# Patient Record
Sex: Female | Born: 1965 | Hispanic: Yes | Smoking: Never smoker
Health system: Southern US, Community
[De-identification: ages and names within clinical notes are randomized; demographics above are authoritative.]

## PROBLEM LIST (undated history)

## (undated) DIAGNOSIS — F419 Anxiety disorder, unspecified: Secondary | ICD-10-CM

## (undated) DIAGNOSIS — K219 Gastro-esophageal reflux disease without esophagitis: Secondary | ICD-10-CM

## (undated) DIAGNOSIS — T7840XA Allergy, unspecified, initial encounter: Secondary | ICD-10-CM

## (undated) DIAGNOSIS — G4730 Sleep apnea, unspecified: Secondary | ICD-10-CM

## (undated) HISTORY — PX: OTHER SURGICAL HISTORY: SHX169

## (undated) HISTORY — DX: Anxiety disorder, unspecified: F41.9

## (undated) HISTORY — DX: Sleep apnea, unspecified: G47.30

## (undated) HISTORY — DX: Allergy, unspecified, initial encounter: T78.40XA

## (undated) HISTORY — DX: Gastro-esophageal reflux disease without esophagitis: K21.9

---

## 2010-06-01 ENCOUNTER — Encounter (INDEPENDENT_AMBULATORY_CARE_PROVIDER_SITE_OTHER): Payer: Self-pay | Admitting: *Deleted

## 2010-06-22 ENCOUNTER — Encounter (INDEPENDENT_AMBULATORY_CARE_PROVIDER_SITE_OTHER): Payer: Self-pay | Admitting: *Deleted

## 2011-01-12 NOTE — Letter (Signed)
Summary: Marie Patient letter  [facility]  [address] [place], [place] [address]   Phone: [phone]  Fax: [phone]       06/01/2010 MRN: [id number]  [facility] [address] [place], [place]  [address]  Dear [name],  Welcome to the Gastroenterology Division at [facility].    You are scheduled to see Dr.  Juanda Chance on 08-31-10 at 2:45pm on the 3rd floor at [facility], [address]  We ask that you try to arrive at our office 15 minutes prior to your appointment time to allow for check-in.  We would like you to complete the enclosed self-administered evaluation form prior to your visit and bring it with you on the day of your appointment.  We will review it with you.  Also, please bring a complete list of all your medications or, if you prefer, bring the medication bottles and we will list them.  Please bring your insurance card so that we may make a copy of it.  If your insurance requires a referral to see a specialist, please bring your referral form from your primary care physician.  Co-payments are due at the time of your visit and may be paid by cash, check or credit card.     Your office visit will consist of a consult with your physician (includes a physical exam), any laboratory testing he/she may order, scheduling of any necessary diagnostic testing (e.g. x-ray, ultrasound, CT-scan), and scheduling of a procedure (e.g. Endoscopy, Colonoscopy) if required.  Please allow enough time on your schedule to allow for any/all of these possibilities.    If you cannot keep your appointment, please call [phone] to cancel or reschedule prior to your appointment date.  This allows [place] the opportunity to schedule an appointment for another patient in need of care.  If you do not cancel or reschedule by 5 p.m. the business day prior to your appointment date, you will be charged a $50.00 late cancellation/no-show fee.    Thank you for choosing [facility] for your medical needs.  We appreciate the opportunity to care for you.  Please visit [place] at our website  to learn more about our practice.                     Sincerely,                                                             The Gastroenterology Division

## 2011-01-12 NOTE — Letter (Signed)
Summary: [facility]  [facility]   Imported By: Lamona Curl CMA (AAMA) 08/26/2010 16:33:25  _____________________________________________________________________  External Attachment:    Type:   Image     Comment:   External Document

## 2013-11-15 ENCOUNTER — Ambulatory Visit: Payer: Self-pay | Admitting: Family Medicine

## 2013-11-15 LAB — CBC WITH DIFFERENTIAL/PLATELET
Basophil #: 0 10*3/uL (ref 0.0–0.1)
Basophil %: 0.8 %
Eosinophil #: 0.2 10*3/uL (ref 0.0–0.7)
Eosinophil %: 2.7 %
HCT: 40 % (ref 35.0–47.0)
HGB: 13.5 g/dL (ref 12.0–16.0)
Lymphocyte #: 1.5 10*3/uL (ref 1.0–3.6)
Lymphocyte %: 26.5 %
MCH: 28.6 pg (ref 26.0–34.0)
Monocyte #: 0.4 x10 3/mm (ref 0.2–0.9)
RBC: 4.72 10*6/uL (ref 3.80–5.20)
RDW: 13.5 % (ref 11.5–14.5)

## 2013-11-15 LAB — RAPID STREP-A WITH REFLX: Micro Text Report: NEGATIVE

## 2013-11-15 LAB — MONONUCLEOSIS SCREEN: Mono Test: NEGATIVE

## 2013-11-18 LAB — BETA STREP CULTURE(ARMC)

## 2015-07-11 ENCOUNTER — Other Ambulatory Visit: Payer: Self-pay | Admitting: Internal Medicine

## 2015-09-28 ENCOUNTER — Encounter: Payer: Self-pay | Admitting: Internal Medicine

## 2015-09-28 DIAGNOSIS — K5901 Slow transit constipation: Secondary | ICD-10-CM | POA: Insufficient documentation

## 2015-09-28 DIAGNOSIS — J309 Allergic rhinitis, unspecified: Secondary | ICD-10-CM | POA: Insufficient documentation

## 2015-09-28 DIAGNOSIS — E782 Mixed hyperlipidemia: Secondary | ICD-10-CM | POA: Insufficient documentation

## 2015-09-28 DIAGNOSIS — B009 Herpesviral infection, unspecified: Secondary | ICD-10-CM | POA: Insufficient documentation

## 2015-09-28 DIAGNOSIS — Z87898 Personal history of other specified conditions: Secondary | ICD-10-CM | POA: Insufficient documentation

## 2015-10-09 ENCOUNTER — Other Ambulatory Visit: Payer: Self-pay | Admitting: Internal Medicine

## 2015-11-12 ENCOUNTER — Ambulatory Visit (INDEPENDENT_AMBULATORY_CARE_PROVIDER_SITE_OTHER): Payer: BLUE CROSS/BLUE SHIELD | Admitting: Internal Medicine

## 2015-11-12 ENCOUNTER — Encounter: Payer: Self-pay | Admitting: Internal Medicine

## 2015-11-12 VITALS — BP 122/60 | HR 64 | Ht 64.0 in | Wt 212.2 lb

## 2015-11-12 DIAGNOSIS — Z114 Encounter for screening for human immunodeficiency virus [HIV]: Secondary | ICD-10-CM | POA: Diagnosis not present

## 2015-11-12 DIAGNOSIS — K5901 Slow transit constipation: Secondary | ICD-10-CM | POA: Diagnosis not present

## 2015-11-12 DIAGNOSIS — H9311 Tinnitus, right ear: Secondary | ICD-10-CM | POA: Diagnosis not present

## 2015-11-12 DIAGNOSIS — G4700 Insomnia, unspecified: Secondary | ICD-10-CM | POA: Diagnosis not present

## 2015-11-12 DIAGNOSIS — M62830 Muscle spasm of back: Secondary | ICD-10-CM

## 2015-11-12 DIAGNOSIS — J309 Allergic rhinitis, unspecified: Secondary | ICD-10-CM | POA: Diagnosis not present

## 2015-11-12 DIAGNOSIS — F5101 Primary insomnia: Secondary | ICD-10-CM | POA: Insufficient documentation

## 2015-11-12 DIAGNOSIS — Z0000 Encounter for general adult medical examination without abnormal findings: Secondary | ICD-10-CM

## 2015-11-12 DIAGNOSIS — E785 Hyperlipidemia, unspecified: Secondary | ICD-10-CM | POA: Diagnosis not present

## 2015-11-12 LAB — POCT URINALYSIS DIPSTICK
Bilirubin, UA: NEGATIVE
Blood, UA: NEGATIVE
Glucose, UA: NEGATIVE
KETONES UA: NEGATIVE
Leukocytes, UA: NEGATIVE
NITRITE UA: NEGATIVE
PH UA: 7
Protein, UA: NEGATIVE
Spec Grav, UA: <=1.005
Urobilinogen, UA: 0.2

## 2015-11-12 MED ORDER — VALACYCLOVIR HCL 1 G PO TABS: 1000.0000 mg | ORAL_TABLET | Freq: Every day | ORAL | Status: DC

## 2015-11-12 MED ORDER — POLYETHYLENE GLYCOL 3350 17 GM/SCOOP PO POWD: 17.0000 g | Freq: Every day | ORAL | Status: DC

## 2015-11-12 MED ORDER — METHOCARBAMOL 750 MG PO TABS: 750.0000 mg | ORAL_TABLET | Freq: Every evening | ORAL | Status: DC | PRN

## 2015-11-12 MED ORDER — BUPROPION HCL ER (XL) 150 MG PO TB24
150.0000 mg | ORAL_TABLET | Freq: Every day | ORAL | Status: DC
Start: 2015-11-12 — End: 2015-12-14

## 2015-11-12 NOTE — Patient Instructions (Signed)
Breast Self-Awareness Practicing breast self-awareness may pick up problems early, prevent significant medical complications, and possibly save your life. By practicing breast self-awareness, you can become familiar with how your breasts look and feel and if your breasts are changing. This allows you to notice changes early. It can also offer you some reassurance that your breast health is good. One way to learn what is normal for your breasts and whether your breasts are changing is to do a breast self-exam. If you find a lump or something that was not present in the past, it is best to contact your caregiver right away. Other findings that should be evaluated by your caregiver include nipple discharge, especially if it is bloody; skin changes or reddening; areas where the skin seems to be pulled in (retracted); or new lumps and bumps. Breast pain is seldom associated with cancer (malignancy), but should also be evaluated by a caregiver. HOW TO PERFORM A BREAST SELF-EXAM The best time to examine your breasts is 5-7 days after your menstrual period is over. During menstruation, the breasts are lumpier, and it may be more difficult to pick up changes. If you do not menstruate, have reached menopause, or had your uterus removed (hysterectomy), you should examine your breasts at regular intervals, such as monthly. If you are breastfeeding, examine your breasts after a feeding or after using a breast pump. Breast implants do not decrease the risk for lumps or tumors, so continue to perform breast self-exams as recommended. Talk to your caregiver about how to determine the difference between the implant and breast tissue. Also, talk about the amount of pressure you should use during the exam. Over time, you will become more familiar with the variations of your breasts and more comfortable with the exam. A breast self-exam requires you to remove all your clothes above the waist. 1. Look at your breasts and nipples.  Stand in front of a mirror in a room with good lighting. With your hands on your hips, push your hands firmly downward. Look for a difference in shape, contour, and size from one breast to the other (asymmetry). Asymmetry includes puckers, dips, or bumps. Also, look for skin changes, such as reddened or scaly areas on the breasts. Look for nipple changes, such as discharge, dimpling, repositioning, or redness. 2. Carefully feel your breasts. This is best done either in the shower or tub while using soapy water or when flat on your back. Place the arm (on the side of the breast you are examining) above your head. Use the pads (not the fingertips) of your three middle fingers on your opposite hand to feel your breasts. Start in the underarm area and use  inch (2 cm) overlapping circles to feel your breast. Use 3 different levels of pressure (light, medium, and firm pressure) at each circle before moving to the next circle. The light pressure is needed to feel the tissue closest to the skin. The medium pressure will help to feel breast tissue a little deeper, while the firm pressure is needed to feel the tissue close to the ribs. Continue the overlapping circles, moving downward over the breast until you feel your ribs below your breast. Then, move one finger-width towards the center of the body. Continue to use the  inch (2 cm) overlapping circles to feel your breast as you move slowly up toward the collar bone (clavicle) near the base of the neck. Continue the up and down exam using all 3 pressures until you reach the   middle of the chest. Do this with each breast, carefully feeling for lumps or changes. 3.  Keep a written record with breast changes or normal findings for each breast. By writing this information down, you do not need to depend only on memory for size, tenderness, or location. Write down where you are in your menstrual cycle, if you are still menstruating. Breast tissue can have some lumps or  thick tissue. However, see your caregiver if you find anything that concerns you.  SEEK MEDICAL CARE IF:  You see a change in shape, contour, or size of your breasts or nipples.   You see skin changes, such as reddened or scaly areas on the breasts or nipples.   You have an unusual discharge from your nipples.   You feel a new lump or unusually thick areas.    This information is not intended to replace advice given to you by your health care provider. Make sure you discuss any questions you have with your health care provider.   Document Released: 11/29/2005 Document Revised: 11/15/2012 Document Reviewed: 03/15/2012 Elsevier Interactive Patient Education 2016 [employer]  

## 2015-11-12 NOTE — Progress Notes (Signed)
Date:  11/12/2015   Name:  [facility]   DOB:  [birth year]   MRN:  [id number]   Chief Complaint: Annual Exam [facility] is a 49 y.o. female who presents today for her Complete Annual Exam. She feels well physically but is having some anxiety related to her husband's health. She reports exercising very little. She reports she is sleeping poorly due to anxiety and neck discomfort.   Constipation - patient states that her constipation is well-controlled with daily MiraLAX. She is occasional rectal discomfort but no bleeding or hemorrhoids. She tries to drink sufficient fluids to help with her symptoms.  Allergies -she has seasonal allergies which cause sinus congestion and drainage. She also has intermittent ringing in the right ear and intermittent pain and blocking. During these times her hearing is decreased. She uses Benadryl or sometimes Claritin.  Oral Herpes - she is on daily suppression for frequent oral outbreaks. She states that her husband has genital herpes and she is wondering if she can be tested for this. She denies any vaginal sores lesions or other symptoms.  Muscle spasm of back and neck - she has intermittent spasms of her neck and back with muscle tightness and discomfort. She takes ibuprofen 400 mg as needed with some relief. In the past she's also used a muscle relaxer at night with good benefit.  Anxiety - she is complaining of some increased anxiety symptoms. It keeps her awake at night so that she cannot sleep.  She has chest discomfort and feels that she has to take a deep sigh when she gets tense. She also has some obsessive actions such as peeling her fingernails and clenching her teeth. She is not depressed and has never been on either anxiety or antidepressant medication.   Review of Systems  HENT: Positive for congestion, postnasal drip, rhinorrhea and tinnitus. Negative for ear discharge, hearing loss, sinus pressure and sore throat.   Eyes: Negative for visual  disturbance.  Respiratory: Negative for cough, chest tightness, shortness of breath and wheezing.   Cardiovascular: Positive for chest pain. Negative for palpitations and leg swelling.  Gastrointestinal: Positive for constipation. Negative for nausea, abdominal pain, blood in stool and anal bleeding.  Endocrine: Negative for polydipsia and polyuria.  Genitourinary: Negative for dysuria, urgency, hematuria, vaginal discharge, genital sores, vaginal pain and pelvic pain.  Musculoskeletal: Positive for myalgias and neck stiffness. Negative for arthralgias and gait problem.  Skin: Negative for color change and rash.  Allergic/Immunologic: Positive for environmental allergies.  Neurological: Negative for dizziness, tremors, weakness, light-headedness and headaches.  Hematological: Negative for adenopathy.  Psychiatric/Behavioral: Positive for sleep disturbance. Negative for suicidal ideas, self-injury, dysphoric mood and decreased concentration. The patient is nervous/anxious.     Patient Active Problem List   Diagnosis Date Noted  . Hyperlipidemia 09/28/2015  . Constipation due to slow transit 09/28/2015  . Herpes simplex disease 09/28/2015  . Allergic rhinitis 09/28/2015  . Hx of abnormal mammogram 09/28/2015    Prior to Admission medications   Medication Sig Start Date End Date Taking? Authorizing Provider  polyethylene glycol (MIRALAX / GLYCOLAX) packet Take 17 g by mouth daily.   Yes Historical Provider, MD  valACYclovir (VALTREX) 1000 MG tablet TAKE 1 TABLET BY MOUTH EVERY DAY 10/09/15  Yes Reubin Milan, MD    Allergies  Allergen Reactions  . Fluconazole Hives    Past Surgical History  Procedure Laterality Date  . None      Social History  Substance Use Topics  .  Smoking status: Never Smoker   . Smokeless tobacco: None  . Alcohol Use: 1.2 oz/week    2 Standard drinks or equivalent per week    Medication list has been reviewed and updated.   Physical Exam   Constitutional: She is oriented to person, place, and time. She appears well-developed and well-nourished. No distress.  HENT:  Head: Normocephalic and atraumatic.  Right Ear: Tympanic membrane and ear canal normal.  Left Ear: Tympanic membrane and ear canal normal.  Nose: Right sinus exhibits no maxillary sinus tenderness. Left sinus exhibits no maxillary sinus tenderness.  Mouth/Throat: Uvula is midline and oropharynx is clear and moist.  Eyes: Conjunctivae and EOM are normal. Right eye exhibits no discharge. Left eye exhibits no discharge. No scleral icterus.  Neck: Normal range of motion. Neck supple. Carotid bruit is not present. No erythema present. No thyromegaly present.  Cardiovascular: Normal rate, regular rhythm, normal heart sounds and normal pulses.   Pulmonary/Chest: Effort normal and breath sounds normal. No respiratory distress. She has no wheezes. She exhibits tenderness. Right breast exhibits no mass, no nipple discharge, no skin change and no tenderness. Left breast exhibits no mass, no nipple discharge, no skin change and no tenderness.  Abdominal: Soft. Bowel sounds are normal. There is no hepatosplenomegaly. There is no tenderness. There is no CVA tenderness.  Genitourinary: Vagina normal and uterus normal. Rectal exam shows mass and tenderness. There is no tenderness or lesion on the right labia. There is no tenderness or lesion on the left labia. Cervix exhibits no motion tenderness, no discharge and no friability. Right adnexum displays no mass, no tenderness and no fullness. Left adnexum displays no mass, no tenderness and no fullness.  Musculoskeletal: Normal range of motion.       Cervical back: She exhibits tenderness and spasm.  Lymphadenopathy:    She has no cervical adenopathy.    She has no axillary adenopathy.  Neurological: She is alert and oriented to person, place, and time. She has normal strength and normal reflexes. No cranial nerve deficit or sensory  deficit. She displays a negative Romberg sign.  Skin: Skin is warm, dry and intact. No rash noted.  Psychiatric: Her speech is normal and behavior is normal. Thought content normal. Her mood appears anxious. Cognition and memory are normal.  Nursing note and vitals reviewed.   BP 122/60 mmHg  Pulse 64  Ht  (1.626 m)  Wt 212 lb 3.2 oz (96.253 kg)  BMI 36.41 kg/m2  Assessment and Plan: 1. Annual physical exam Recent mammogram was normal Oral HSV controlled with daily Valtrex Patient advised that blood test for HSV would be inconclusive since she has known disease (her husband has genital herpes) - POCT urinalysis dipstick - CBC with Differential/Platelet - Comprehensive metabolic panel - Pap IG and HPV (high risk) DNA detection  2. Allergic rhinitis, unspecified allergic rhinitis type Attending Claritin or Benadryl as needed  3. Constipation due to slow transit Doing well on MiraLAX - TSH - polyethylene glycol powder (GLYCOLAX/MIRALAX) powder; Take 17 g by mouth daily.  Dispense: 3350 g; Refill: 1  4. Hyperlipidemia Advise on medication when labs return - Lipid panel  5. Encounter for screening for HIV - HIV antibody  6. Muscle spasm of back Continue Advil; also use heat or ice - methocarbamol (ROBAXIN) 750 MG tablet; Take 1 tablet (750 mg total) by mouth at bedtime as needed for muscle spasms.  Dispense: 90 tablet; Refill: 0  7. Insomnia Related to anxiety and husband's  health issues - buPROPion (WELLBUTRIN XL) 150 MG 24 hr tablet; Take 1 tablet (150 mg total) by mouth daily.  Dispense: 90 tablet; Refill: 0  8. Tinnitus of right ear - Ambulatory referral to ENT   Bari EdwardLaura Berglund, MD [facility]  11/12/2015

## 2015-11-13 LAB — LIPID PANEL
CHOLESTEROL TOTAL: 218 mg/dL — AB (ref 100–199)
Chol/HDL Ratio: 4.8 ratio units — ABNORMAL HIGH (ref 0.0–4.4)
HDL: 45 mg/dL (ref >39)
LDL Calculated: 151 mg/dL — ABNORMAL HIGH (ref 0–99)
Triglycerides: 111 mg/dL (ref 0–149)
VLDL CHOLESTEROL CAL: 22 mg/dL (ref 5–40)

## 2015-11-13 LAB — COMPREHENSIVE METABOLIC PANEL
ALBUMIN: 4.6 g/dL (ref 3.5–5.5)
ALT: 25 IU/L (ref 0–32)
AST: 34 IU/L (ref 0–40)
Albumin/Globulin Ratio: 1.2 (ref 1.1–2.5)
Alkaline Phosphatase: 82 IU/L (ref 39–117)
BILIRUBIN TOTAL: 0.4 mg/dL (ref 0.0–1.2)
BUN / CREAT RATIO: 14 (ref 9–23)
BUN: 10 mg/dL (ref 6–24)
CO2: 26 mmol/L (ref 18–29)
CREATININE: 0.69 mg/dL (ref 0.57–1.00)
Calcium: 9.6 mg/dL (ref 8.7–10.2)
Chloride: 100 mmol/L (ref 97–106)
GFR calc Af Amer: 118 mL/min/{1.73_m2} (ref >59)
GFR calc non Af Amer: 103 mL/min/{1.73_m2} (ref >59)
GLOBULIN, TOTAL: 3.9 g/dL (ref 1.5–4.5)
GLUCOSE: 87 mg/dL (ref 65–99)
POTASSIUM: 5 mmol/L (ref 3.5–5.2)
Sodium: 141 mmol/L (ref 136–144)
Total Protein: 8.5 g/dL (ref 6.0–8.5)

## 2015-11-13 LAB — CBC WITH DIFFERENTIAL/PLATELET
Basophils Absolute: 0 10*3/uL (ref 0.0–0.2)
Basos: 0 %
EOS (ABSOLUTE): 0.1 10*3/uL (ref 0.0–0.4)
Eos: 3 %
HEMOGLOBIN: 12.7 g/dL (ref 11.1–15.9)
Hematocrit: 37.8 % (ref 34.0–46.6)
Immature Grans (Abs): 0 10*3/uL (ref 0.0–0.1)
Immature Granulocytes: 0 %
Lymphocytes Absolute: 1.7 10*3/uL (ref 0.7–3.1)
Lymphs: 34 %
MCH: 27.8 pg (ref 26.6–33.0)
MCHC: 33.6 g/dL (ref 31.5–35.7)
MCV: 83 fL (ref 79–97)
MONOCYTES: 6 %
MONOS ABS: 0.3 10*3/uL (ref 0.1–0.9)
Neutrophils Absolute: 2.8 10*3/uL (ref 1.4–7.0)
Neutrophils: 57 %
PLATELETS: 343 10*3/uL (ref 150–379)
RBC: 4.57 x10E6/uL (ref 3.77–5.28)
RDW: 13.6 % (ref 12.3–15.4)
WBC: 4.8 10*3/uL (ref 3.4–10.8)

## 2015-11-13 LAB — TSH: TSH: 2.48 u[IU]/mL (ref 0.450–4.500)

## 2015-11-13 LAB — HIV ANTIBODY (ROUTINE TESTING W REFLEX): HIV SCREEN 4TH GENERATION: NONREACTIVE

## 2015-11-20 LAB — PAP IG AND HPV HIGH-RISK
HPV, high-risk: NEGATIVE
PAP SMEAR COMMENT: 0

## 2015-12-13 ENCOUNTER — Encounter: Payer: Self-pay | Admitting: Emergency Medicine

## 2015-12-13 ENCOUNTER — Emergency Department
Admission: EM | Admit: 2015-12-13 | Discharge: 2015-12-13 | Disposition: A | Discharge status: Home / Self Care | Payer: BLUE CROSS/BLUE SHIELD | Attending: Emergency Medicine | Admitting: Emergency Medicine

## 2015-12-13 ENCOUNTER — Emergency Department: Payer: BLUE CROSS/BLUE SHIELD

## 2015-12-13 DIAGNOSIS — K209 Esophagitis, unspecified without bleeding: Secondary | ICD-10-CM

## 2015-12-13 DIAGNOSIS — R1010 Upper abdominal pain, unspecified: Secondary | ICD-10-CM | POA: Diagnosis present

## 2015-12-13 DIAGNOSIS — K8020 Calculus of gallbladder without cholecystitis without obstruction: Secondary | ICD-10-CM | POA: Insufficient documentation

## 2015-12-13 DIAGNOSIS — Z79899 Other long term (current) drug therapy: Secondary | ICD-10-CM | POA: Diagnosis not present

## 2015-12-13 DIAGNOSIS — R112 Nausea with vomiting, unspecified: Secondary | ICD-10-CM

## 2015-12-13 DIAGNOSIS — K589 Irritable bowel syndrome without diarrhea: Secondary | ICD-10-CM | POA: Insufficient documentation

## 2015-12-13 DIAGNOSIS — R1011 Right upper quadrant pain: Secondary | ICD-10-CM

## 2015-12-13 LAB — COMPREHENSIVE METABOLIC PANEL
ALBUMIN: 4.2 g/dL (ref 3.5–5.0)
ALT: 35 U/L (ref 14–54)
ANION GAP: 6 (ref 5–15)
AST: 33 U/L (ref 15–41)
Alkaline Phosphatase: 72 U/L (ref 38–126)
BILIRUBIN TOTAL: 0.7 mg/dL (ref 0.3–1.2)
BUN: 15 mg/dL (ref 6–20)
CHLORIDE: 104 mmol/L (ref 101–111)
CO2: 28 mmol/L (ref 22–32)
Calcium: 9 mg/dL (ref 8.9–10.3)
Creatinine, Ser: 0.69 mg/dL (ref 0.44–1.00)
GFR calc Af Amer: >60 mL/min (ref >60)
GFR calc non Af Amer: >60 mL/min (ref >60)
GLUCOSE: 138 mg/dL — AB (ref 65–99)
POTASSIUM: 3.9 mmol/L (ref 3.5–5.1)
SODIUM: 138 mmol/L (ref 135–145)
Total Protein: 8.6 g/dL — ABNORMAL HIGH (ref 6.5–8.1)

## 2015-12-13 LAB — URINALYSIS COMPLETE WITH MICROSCOPIC (ARMC ONLY)
Bilirubin Urine: NEGATIVE
GLUCOSE, UA: NEGATIVE mg/dL
Hgb urine dipstick: NEGATIVE
Leukocytes, UA: NEGATIVE
NITRITE: NEGATIVE
PROTEIN: 30 mg/dL — AB
SPECIFIC GRAVITY, URINE: 1.025 (ref 1.005–1.030)
pH: 8 (ref 5.0–8.0)

## 2015-12-13 LAB — CBC
HEMATOCRIT: 36.7 % (ref 35.0–47.0)
HEMOGLOBIN: 12.4 g/dL (ref 12.0–16.0)
MCH: 28.2 pg (ref 26.0–34.0)
MCHC: 33.8 g/dL (ref 32.0–36.0)
MCV: 83.4 fL (ref 80.0–100.0)
Platelets: 301 10*3/uL (ref 150–440)
RBC: 4.4 MIL/uL (ref 3.80–5.20)
RDW: 13.2 % (ref 11.5–14.5)
WBC: 9.6 10*3/uL (ref 3.6–11.0)

## 2015-12-13 LAB — LIPASE, BLOOD: Lipase: 34 U/L (ref 11–51)

## 2015-12-13 MED ORDER — HYDROMORPHONE HCL 1 MG/ML IJ SOLN
INTRAMUSCULAR | Status: AC
  Administered 2015-12-13: 0.5 mg via INTRAVENOUS
  Filled 2015-12-13: qty 1

## 2015-12-13 MED ORDER — ONDANSETRON HCL 4 MG PO TABS: 4.0000 mg | ORAL_TABLET | Freq: Every day | ORAL | Status: DC | PRN

## 2015-12-13 MED ORDER — PB-HYOSCY-ATROPINE-SCOPOLAMINE 16.2 MG PO TABS: 1.0000 | ORAL_TABLET | Freq: Three times a day (TID) | ORAL | Status: DC | PRN

## 2015-12-13 MED ORDER — GI COCKTAIL ~~LOC~~
30.0000 mL | Freq: Once | ORAL | Status: AC
  Administered 2015-12-13: 30 mL via ORAL
  Filled 2015-12-13: qty 30

## 2015-12-13 MED ORDER — HYDROCODONE-ACETAMINOPHEN 5-325 MG PO TABS: 1.0000 | ORAL_TABLET | ORAL | Status: DC | PRN

## 2015-12-13 MED ORDER — HYDROMORPHONE HCL 1 MG/ML IJ SOLN
0.5000 mg | INTRAMUSCULAR | Status: AC | PRN
  Administered 2015-12-13 (×2): 0.5 mg via INTRAVENOUS

## 2015-12-13 MED ORDER — FAMOTIDINE 20 MG PO TABS: 20.0000 mg | ORAL_TABLET | Freq: Two times a day (BID) | ORAL | Status: DC

## 2015-12-13 MED ORDER — FAMOTIDINE IN NACL 20-0.9 MG/50ML-% IV SOLN
20.0000 mg | Freq: Once | INTRAVENOUS | Status: AC
  Administered 2015-12-13: 20 mg via INTRAVENOUS
  Filled 2015-12-13: qty 50

## 2015-12-13 MED ORDER — ONDANSETRON HCL 4 MG/2ML IJ SOLN
4.0000 mg | Freq: Once | INTRAMUSCULAR | Status: AC | PRN
  Administered 2015-12-13: 4 mg via INTRAVENOUS
  Filled 2015-12-13: qty 2

## 2015-12-13 NOTE — ED Notes (Signed)
Reports abd pain and vomiting since 2am

## 2015-12-13 NOTE — ED Provider Notes (Signed)
[facility] Emergency Department Provider Note  ____________________________________________  Time seen: 7:30 AM  I have reviewed the triage vital signs and the nursing notes.  History by:  Patient  HISTORY  Chief Complaint Abdominal Pain    HPI [facility] is a 49 y.o. female who presents to emergency department with pain in her upper central abdomen. This began around 2 AM. The discomfort waxes and wanes and is moderate to severe.  It is worse when she is lying down. She has also had some nausea with one or 2 episodes of emesis. She has had a bowel movement, but denies any diarrhea. She has a history of irritable bowel syndrome and other poorly diagnose abdominal issues. She has not had any abdominal surgeries.    History reviewed. No pertinent past medical history.  Patient Active Problem List   Diagnosis Date Noted  . Muscle spasm of back 11/12/2015  . Insomnia 11/12/2015  . Tinnitus of right ear 11/12/2015  . Hyperlipidemia 09/28/2015  . Constipation due to slow transit 09/28/2015  . Herpes simplex disease 09/28/2015  . Allergic rhinitis 09/28/2015  . Hx of abnormal mammogram 09/28/2015    Past Surgical History  Procedure Laterality Date  . None      Current Outpatient Rx  Name  Route  Sig  Dispense  Refill  . buPROPion (WELLBUTRIN XL) 150 MG 24 hr tablet   Oral   Take 1 tablet (150 mg total) by mouth daily.   90 tablet   0   . methocarbamol (ROBAXIN) 750 MG tablet   Oral   Take 1 tablet (750 mg total) by mouth at bedtime as needed for muscle spasms.   90 tablet   0   . polyethylene glycol powder (GLYCOLAX/MIRALAX) powder   Oral   Take 17 g by mouth daily.   3350 g   1   . valACYclovir (VALTREX) 1000 MG tablet   Oral   Take 1 tablet (1,000 mg total) by mouth daily.   90 tablet   3   . belladona alk-PHENObarbital (DONNATAL) 16.2 MG tablet   Oral   Take 1 tablet by mouth every 8 (eight) hours as needed.   30 tablet    1   . famotidine (PEPCID) 20 MG tablet   Oral   Take 1 tablet (20 mg total) by mouth 2 (two) times daily.   60 tablet   1   . HYDROcodone-acetaminophen (NORCO) 5-325 MG tablet   Oral   Take 1 tablet by mouth every 4 (four) hours as needed for moderate pain.   10 tablet   0   . ondansetron (ZOFRAN) 4 MG tablet   Oral   Take 1 tablet (4 mg total) by mouth daily as needed for nausea or vomiting.   10 tablet   0     Allergies Fluconazole  Family History  Problem Relation Age of Onset  . CAD Mother     Social History Social History  Substance Use Topics  . Smoking status: Never Smoker   . Smokeless tobacco: None  . Alcohol Use: 1.2 oz/week    2 Standard drinks or equivalent per week    Review of Systems  Constitutional: Negative for fever/chills. ENT: Negative for congestion. Cardiovascular: Negative for chest pain. Respiratory: Negative for cough. Gastrointestinal: Positive for upper abdominal pain with nausea and vomiting. See history of present illness Genitourinary: Negative for dysuria. Musculoskeletal: No back pain. Skin: Negative for rash. Neurological: Negative for headache or  focal weakness   10-point ROS otherwise negative.  ____________________________________________   PHYSICAL EXAM:  VITAL SIGNS: ED Triage Vitals  Enc Vitals Group     BP 12/13/15 0713 113/64 mmHg     Pulse Rate 12/13/15 0713 66     Resp 12/13/15 0713 20     Temp 12/13/15 0713 98.1 F (36.7 C)     Temp Source 12/13/15 0713 Oral     SpO2 12/13/15 0713 100 %     Weight 12/13/15 0713 215 lb (97.523 kg)     Height 12/13/15 0713 5' 4"  (1.626 m)     Head Cir --      Peak Flow --      Pain Score 12/13/15 0713 10     Pain Loc --      Pain Edu? --      Excl. in [place]? --     Constitutional:  Alert and oriented. Appears uncomfortable, sitting on a chair beside the bed, holding an emesis bag. ENT   Head: Normocephalic and atraumatic.   Nose: No congestion/rhinnorhea.        Mouth: No erythema, no swelling   Cardiovascular: Normal rate, regular rhythm, no murmur noted Respiratory:  Normal respiratory effort, no tachypnea.    Breath sounds are clear and equal bilaterally.  Gastrointestinal: Soft, nondistended, but with notable pain in the right upper quadrant and epigastric area. Back: No muscle spasm, no tenderness, no CVA tenderness. Musculoskeletal: No deformity noted. Nontender with normal range of motion in all extremities.  No noted edema. Neurologic:  Communicative. Normal appearing spontaneous movement in all 4 extremities. No gross focal neurologic deficits are appreciated.  Skin:  Skin is warm, dry. No rash noted. Psychiatric: Mood and affect are normal. Speech and behavior are normal.  ____________________________________________    LABS (pertinent positives/negatives)  Labs Reviewed  COMPREHENSIVE METABOLIC PANEL - Abnormal; Notable for the following:    Glucose, Bld 138 (*)    Total Protein 8.6 (*)    All other components within normal limits  URINALYSIS COMPLETEWITH MICROSCOPIC ([facility] ONLY) - Abnormal; Notable for the following:    Color, Urine YELLOW (*)    APPearance CLEAR (*)    Ketones, ur TRACE (*)    Protein, ur 30 (*)    Bacteria, UA RARE (*)    Squamous Epithelial / LPF 0-5 (*)    All other components within normal limits  LIPASE, BLOOD  CBC     ____________________________________________  RADIOLOGY  Ultrasound, right upper quadrant: FINDINGS: Gallbladder:  Numerous gallstones. Negative sonographic Murphy's sign. No wall thickening.  Common bile duct:  Diameter: 4.0 mm, normal.  Liver:  No focal lesion identified. Within normal limits in parenchymal echogenicity.  IMPRESSION: Extensive cholelithiasis.  ____________________________________________   PROCEDURES    ____________________________________________   INITIAL IMPRESSION / ASSESSMENT AND PLAN / ED COURSE  Pertinent labs & imaging results  that were available during my care of the patient were reviewed by me and considered in my medical decision making (see chart for details).  49 year old female with significant pain in her epigastric and right upper quadrant with nausea and some vomiting. We will check general abdominal labs and obtain an ultrasound of the right upper quadrant. We are also treating her with Zofran and Dilaudid.  ----------------------------------------- 10:22 AM on 12/13/2015 -----------------------------------------  Ultrasound shows numerous gallstones, but without sign of cholecystitis.  Blood tests are overall reasonable, including liver enzyme tests and bilirubin.  Reevaluation of this time finds the patient ambulatory to the  sink. She appears more comfortable. She reports her pain is better but is still approximately 6-7/10. She reports the pain is episodic. This is primarily in the epigastric area and up into the chest. It is not focal in the right upper quadrant and may not be due to the noted cholelithiasis that she currently has. She reports drinking cold water while in the emergency department seemed to make it worse. We will give her some Maalox and reassess.  ----------------------------------------- 10:57 AM on 12/13/2015 -----------------------------------------  The patient shared with the pharmacy tech that she wanted to try an H2 blocker for this discomfort. I do not think that that will give her immediate relief, but it is a reasonable step. I have ordered famotidine.  ----------------------------------------- 11:50 AM on 12/13/2015 -----------------------------------------  The patient is significantly better. She is smiling and appears comfortable. She believes the famotidine IV worked within 10 minutes, but she was also given a GI cocktail at the same time. I certainly think that the GI cocktail helped her much more than the famotidine over this particular time frame. We'll continue her  on famotidine, but also prescribed Donnatal. If she has recurrence of pain, I instructed her to take Donnatal and Maalox. She may also take Vicodin if needed. I'll prescribe Zofran for nausea as well as. I've gone over this with her friend as well, including her cholelithiasis and need for surgical follow-up.  ____________________________________________   FINAL CLINICAL IMPRESSION(S) / ED DIAGNOSES  Final diagnoses:  Esophagitis  Calculus of gallbladder without cholecystitis without obstruction  Non-intractable vomiting with nausea, vomiting of unspecified type   irritable bowel syndrome    Ahmed Prima, MD 12/13/15 1220

## 2015-12-13 NOTE — Discharge Instructions (Signed)
Your discomfort appears to be coming from your esophagus. Take famotidine as prescribed. Use Donnatal as needed. If you have further discomfort, take Maalox or another liquid coating agent similar to that. If your pain is uncontrolled, he may take hydrocodone. Take Zofran for nausea if needed. Follow-up with her regular doctor as well as with the surgeons for further evaluation about the gallstones noted in your gallbladder. Return to the emergency department if your pain is uncontrolled or if you have other urgent concerns.   Cholelithiasis Cholelithiasis (also called gallstones) is a form of gallbladder disease. The gallbladder is a small organ that helps you digest fats. Symptoms of gallstones are:  Feeling sick to your stomach (nausea).  Throwing up (vomiting).  Belly pain.  Yellowing of the skin (jaundice).  Sudden pain. You may feel the pain for minutes to hours.  Fever.  Pain to the touch. HOME CARE  Only take medicines as told by your doctor.  Eat a low-fat diet until you see your doctor again. Eating fat can result in pain.  Follow up with your doctor as told. Attacks usually happen time after time. Surgery is usually needed for permanent treatment. GET HELP RIGHT AWAY IF:   Your pain gets worse.  Your pain is not helped by medicines.  You have a fever and lasting symptoms for more than 2-3 days.  You have a fever and your symptoms suddenly get worse.  You keep feeling sick to your stomach and throwing up. MAKE SURE YOU:   Understand these instructions.  Will watch your condition.  Will get help right away if you are not doing well or get worse.   This information is not intended to replace advice given to you by your health care provider. Make sure you discuss any questions you have with your health care provider.   Document Released: 05/17/2008 Document Revised: 08/01/2013 Document Reviewed: 05/23/2013 Elsevier Interactive Patient Education 2016 Elsevier  Inc.  Esophagitis Esophagitis is inflammation of the esophagus. The esophagus is the tube that carries food and liquids from your mouth to your stomach. Esophagitis can cause soreness or pain in the esophagus. This condition can make it difficult and painful to swallow.  CAUSES Most causes of esophagitis are not serious. Common causes of this condition include:  Gastroesophageal reflux disease (GERD). This is when stomach contents move back up into the esophagus (reflux).  Repeated vomiting.  An allergic-type reaction, especially caused by food allergies (eosinophilic esophagitis).  Injury to the esophagus by swallowing large pills with or without water, or swallowing certain types of medicines.  Swallowing (ingesting) harmful chemicals, such as household cleaning products.  Heavy alcohol use.  An infection of the esophagus.This most often occurs in people who have a weakened immune system.  Radiation or chemotherapy treatment for cancer.  Certain diseases such as sarcoidosis, Crohn disease, and scleroderma. SYMPTOMS Symptoms of this condition include:  Difficult or painful swallowing.  Pain with swallowing acidic liquids, such as citrus juices.  Pain with burping.  Chest pain.  Difficulty breathing.  Nausea.  Vomiting.  Pain in the abdomen.  Weight loss.  Ulcers in the mouth.  Patches of white material in the mouth (candidiasis).  Fever.  Coughing up blood or vomiting blood.  Stool that is black, tarry, or bright red. DIAGNOSIS Your health care provider will take a medical history and perform a physical exam. You may also have other tests, including:  An endoscopy to examine your stomach and esophagus with a small camera.  A test that measures the acidity level in your esophagus.  A test that measures how much pressure is on your esophagus.  A barium swallow or modified barium swallow to show the shape, size, and functioning of your  esophagus.  Allergy tests. TREATMENT Treatment for this condition depends on the cause of your esophagitis. In some cases, steroids or other medicines may be given to help relieve your symptoms or to treat the underlying cause of your condition. You may have to make some lifestyle changes, such as:  Avoiding alcohol.  Quitting smoking.  Changing your diet.  Exercising.  Changing your sleep habits and your sleep environment. HOME CARE INSTRUCTIONS Take these actions to decrease your discomfort and to help avoid complications. Diet  Follow a diet as recommended by your health care provider. This may involve avoiding foods and drinks such as:  Coffee and tea (with or without caffeine).  Drinks that contain alcohol.  Energy drinks and sports drinks.  Carbonated drinks or sodas.  Chocolate and cocoa.  Peppermint and mint flavorings.  Garlic and onions.  Horseradish.  Spicy and acidic foods, including peppers, chili powder, curry powder, vinegar, hot sauces, and barbecue sauce.  Citrus fruit juices and citrus fruits, such as oranges, lemons, and limes.  Tomato-based foods, such as red sauce, chili, salsa, and pizza with red sauce.  Fried and fatty foods, such as donuts, french fries, potato chips, and high-fat dressings.  High-fat meats, such as hot dogs and fatty cuts of red and white meats, such as rib eye steak, sausage, ham, and bacon.  High-fat dairy items, such as whole milk, butter, and cream cheese.  Eat small, frequent meals instead of large meals.  Avoid drinking large amounts of liquid with your meals.  Avoid eating meals during the 2-3 hours before bedtime.  Avoid lying down right after you eat.  Do not exercise right after you eat.  Avoid foods and drinks that seem to make your symptoms worse. General Instructions  Pay attention to any changes in your symptoms.  Take over-the-counter and prescription medicines only as told by your health care  provider. Do not take aspirin, ibuprofen, or other NSAIDs unless your health care provider told you to do so.  If you have trouble taking pills, use a pill splitter to decrease the size of the pill. This will decrease the chance of the pill getting stuck or injuring your esophagus on the way down. Also, drink water after you take a pill.  Do not use any tobacco products, including cigarettes, chewing tobacco, and e-cigarettes. If you need help quitting, ask your health care provider.  Wear loose-fitting clothing. Do not wear anything tight around your waist that causes pressure on your abdomen.  Raise (elevate) the head of your bed about 6 inches (15 cm).  Try to reduce your stress, such as with yoga or meditation. If you need help reducing stress, ask your health care provider.  If you are overweight, reduce your weight to an amount that is healthy for you. Ask your health care provider for guidance about a safe weight loss goal.  Keep all follow-up visits as told by your health care provider. This is important. SEEK MEDICAL CARE IF:  You have new symptoms.  You have unexplained weight loss.  You have difficulty swallowing, or it hurts to swallow.  You have wheezing or a persistent cough.  Your symptoms do not improve with treatment.  You have frequent heartburn for more than two weeks. SEEK IMMEDIATE  MEDICAL CARE IF:  You have severe pain in your arms, neck, jaw, teeth, or back.  You feel sweaty, dizzy, or light-headed.  You have chest pain or shortness of breath.  You vomit and your vomit looks like blood or coffee grounds.  Your stool is bloody or black.  You have a fever.  You cannot swallow, drink, or eat.   This information is not intended to replace advice given to you by your health care provider. Make sure you discuss any questions you have with your health care provider.   Document Released: 01/06/2005 Document Revised: 08/20/2015 Document Reviewed:  03/26/2015 Elsevier Interactive Patient Education [employer]

## 2015-12-14 ENCOUNTER — Other Ambulatory Visit: Payer: Self-pay | Admitting: Internal Medicine

## 2016-01-12 ENCOUNTER — Encounter: Payer: Self-pay | Admitting: Internal Medicine

## 2016-01-12 ENCOUNTER — Ambulatory Visit (INDEPENDENT_AMBULATORY_CARE_PROVIDER_SITE_OTHER): Payer: BLUE CROSS/BLUE SHIELD | Admitting: Internal Medicine

## 2016-01-12 ENCOUNTER — Other Ambulatory Visit: Payer: Self-pay | Admitting: Internal Medicine

## 2016-01-12 VITALS — BP 122/70 | HR 76 | Temp 98.8°F | Ht 64.0 in | Wt 213.8 lb

## 2016-01-12 DIAGNOSIS — M62830 Muscle spasm of back: Secondary | ICD-10-CM

## 2016-01-12 DIAGNOSIS — K8020 Calculus of gallbladder without cholecystitis without obstruction: Secondary | ICD-10-CM

## 2016-01-12 DIAGNOSIS — G4700 Insomnia, unspecified: Secondary | ICD-10-CM

## 2016-01-12 DIAGNOSIS — F419 Anxiety disorder, unspecified: Secondary | ICD-10-CM | POA: Insufficient documentation

## 2016-01-12 DIAGNOSIS — F411 Generalized anxiety disorder: Secondary | ICD-10-CM | POA: Diagnosis not present

## 2016-01-12 DIAGNOSIS — K594 Anal spasm: Secondary | ICD-10-CM | POA: Diagnosis not present

## 2016-01-12 MED ORDER — CYCLOBENZAPRINE HCL 10 MG PO TABS: 10.0000 mg | ORAL_TABLET | Freq: Three times a day (TID) | ORAL | Status: DC | PRN

## 2016-01-12 MED ORDER — TEMAZEPAM 7.5 MG PO CAPS: 7.5000 mg | ORAL_CAPSULE | Freq: Every evening | ORAL | Status: DC | PRN

## 2016-01-12 NOTE — Progress Notes (Signed)
Date:  01/12/2016   Name:  Marie Whitney   DOB:  1966-02-03   MRN:  [id number]   Chief Complaint: Follow-up; herpes syndrome; and Back Pain Back Pain This is a recurrent problem. The problem occurs every several days. The problem has been gradually improving since onset. The pain is present in the lumbar spine. The quality of the pain is described as aching. The symptoms are aggravated by bending and coughing. Associated symptoms include abdominal pain. Pertinent negatives include no bladder incontinence, bowel incontinence, chest pain, dysuria, fever, paresis or weakness. She has tried muscle relaxant (robaxin causes nausea) for the symptoms.  Anxiety Presents for follow-up visit. The problem has been gradually improving. Symptoms include excessive worry, nervous/anxious behavior and palpitations. Patient reports no chest pain, impotence, nausea, shortness of breath or suicidal ideas. Primary symptoms comment: insomnia. Symptoms occur occasionally. The severity of symptoms is moderate.   Treatments tried: wellbutrin started last visit. The treatment provided moderate relief. Compliance with prior treatments has been good. Side effects of treatment include GI discomfort (and still has insomnia).  Abdominal Pain This is a recurrent problem. The current episode started more than 1 month ago. The onset quality is sudden. The problem occurs daily. The problem has been waxing and waning. The pain is located in the epigastric region. The pain is mild. The quality of the pain is colicky and cramping. The abdominal pain radiates to the epigastric region. Pertinent negatives include no diarrhea, dysuria, fever, myalgias, nausea or vomiting. Prior diagnostic workup includes ultrasound (last month showed multiple gallstones).  She reports having gall stones 20+ years ago in [place] treated with Ursodiol.  The stones reportedly dissolved.  Now she wants to have the same treatment again.   Patient also  complains of intermittent rectal spasm. He thinks that's might be due to hemorrhoids. She is not interested in having hemorrhoid surgery however. She would like to investigate other therapies for spasm.  Review of Systems  Constitutional: Negative for fever and chills.  Respiratory: Negative for shortness of breath and wheezing.   Cardiovascular: Positive for palpitations. Negative for chest pain.  Gastrointestinal: Positive for abdominal pain. Negative for nausea, vomiting, diarrhea, blood in stool and bowel incontinence.  Genitourinary: Negative for bladder incontinence, dysuria and impotence.  Musculoskeletal: Positive for back pain. Negative for myalgias, joint swelling and gait problem.  Neurological: Negative for syncope and weakness.  Psychiatric/Behavioral: Positive for sleep disturbance. Negative for suicidal ideas. The patient is nervous/anxious.     Patient Active Problem List   Diagnosis Date Noted  . Muscle spasm of back 11/12/2015  . Insomnia 11/12/2015  . Tinnitus of right ear 11/12/2015  . Hyperlipidemia 09/28/2015  . Constipation due to slow transit 09/28/2015  . Herpes simplex disease 09/28/2015  . Allergic rhinitis 09/28/2015  . Hx of abnormal mammogram 09/28/2015    Prior to Admission medications   Medication Sig Start Date End Date Taking? Authorizing Provider  buPROPion (WELLBUTRIN XL) 150 MG 24 hr tablet TAKE 1 TABLET (150 MG TOTAL) BY MOUTH DAILY. 12/15/15  Yes Glean Hess, MD  famotidine (PEPCID) 20 MG tablet Take 1 tablet (20 mg total) by mouth 2 (two) times daily. 12/13/15 12/12/16 Yes Ahmed Prima, MD  methocarbamol (ROBAXIN) 750 MG tablet Take 1 tablet (750 mg total) by mouth at bedtime as needed for muscle spasms. 11/12/15  Yes Glean Hess, MD  polyethylene glycol powder (GLYCOLAX/MIRALAX) powder Take 17 g by mouth daily. 11/12/15  Yes Glean Hess,  MD  valACYclovir (VALTREX) 1000 MG tablet Take 1 tablet (1,000 mg total) by mouth daily.  11/12/15  Yes Glean Hess, MD  belladona alk-PHENObarbital (DONNATAL) 16.2 MG tablet Take 1 tablet by mouth every 8 (eight) hours as needed. Patient not taking: Reported on 01/12/2016 12/13/15   Ahmed Prima, MD  HYDROcodone-acetaminophen [facility]) 5-325 MG tablet Take 1 tablet by mouth every 4 (four) hours as needed for moderate pain. Patient not taking: Reported on 01/12/2016 12/13/15   Ahmed Prima, MD  ondansetron (ZOFRAN) 4 MG tablet Take 1 tablet (4 mg total) by mouth daily as needed for nausea or vomiting. Patient not taking: Reported on 01/12/2016 12/13/15   Ahmed Prima, MD    Allergies  Allergen Reactions  . Fluconazole Hives    Past Surgical History  Procedure Laterality Date  . None      Social History  Substance Use Topics  . Smoking status: Never Smoker   . Smokeless tobacco: None  . Alcohol Use: 1.2 oz/week    2 Standard drinks or equivalent per week     Medication list has been reviewed and updated.   Physical Exam  Constitutional: She appears well-developed and well-nourished.  Neck: Normal range of motion. No thyromegaly present.  Cardiovascular: Normal rate, regular rhythm and normal heart sounds.   Pulmonary/Chest: Effort normal and breath sounds normal. She has no wheezes.  Abdominal: Soft. Bowel sounds are normal. There is no hepatosplenomegaly. There is tenderness in the epigastric area. There is no rigidity, no rebound, no guarding and no CVA tenderness.  Musculoskeletal:       Lumbar back: She exhibits tenderness and spasm. She exhibits no swelling and no edema.  Neurological: She has normal reflexes.  Nursing note and vitals reviewed.   BP 122/70 mmHg  Pulse 76  Ht 5' 4"  (1.626 m)  Wt 213 lb 12.8 oz (96.979 kg)  BMI 36.68 kg/m2  Assessment and Plan: 1. Muscle spasm of back Discontinue Robaxin - cyclobenzaprine (FLEXERIL) 10 MG tablet; Take 1 tablet (10 mg total) by mouth 3 (three) times daily as needed for muscle spasms.   Dispense: 30 tablet; Refill: 0  2. Insomnia Try low-dose Restoril - temazepam (RESTORIL) 7.5 MG capsule; Take 1 capsule (7.5 mg total) by mouth at bedtime as needed for sleep.  Dispense: 30 capsule; Refill: 0  3. Generalized anxiety disorder Approved on Wellbutrin  4. Gallstones Patient advised on not familiar with treatment for gallstones with ursodiol or laser surgery I recommend general surgery consultation but she would like to discuss other options with GI - Ambulatory referral to Gastroenterology  5. Rectal spasm Patient request GI evaluation - Ambulatory referral to Gastroenterology   Halina Maidens, MD [facility]  01/12/2016

## 2016-01-15 ENCOUNTER — Encounter: Payer: Self-pay | Admitting: Physician Assistant

## 2016-02-12 ENCOUNTER — Encounter: Payer: Self-pay | Admitting: Physician Assistant

## 2016-02-12 ENCOUNTER — Ambulatory Visit (INDEPENDENT_AMBULATORY_CARE_PROVIDER_SITE_OTHER): Payer: BLUE CROSS/BLUE SHIELD | Admitting: Physician Assistant

## 2016-02-12 VITALS — BP 124/84 | HR 86 | Ht 64.0 in | Wt 218.0 lb

## 2016-02-12 DIAGNOSIS — K8020 Calculus of gallbladder without cholecystitis without obstruction: Secondary | ICD-10-CM

## 2016-02-12 DIAGNOSIS — K594 Anal spasm: Secondary | ICD-10-CM | POA: Diagnosis not present

## 2016-02-12 DIAGNOSIS — Z8719 Personal history of other diseases of the digestive system: Secondary | ICD-10-CM | POA: Diagnosis not present

## 2016-02-12 MED ORDER — HYOSCYAMINE SULFATE 0.125 MG SL SUBL: SUBLINGUAL_TABLET | SUBLINGUAL | Status: DC

## 2016-02-12 NOTE — Progress Notes (Signed)
Reviewed and agree with management plan.  Hadassah Rana T. Ayianna Darnold, MD FACG 

## 2016-02-12 NOTE — Progress Notes (Signed)
Patient ID: [name], female   DOB: 10-12-1966, 50 y.o.   MRN: [id number]   Subjective:    Patient ID: Marie Whitney, female    DOB: Jun 20, 1966, 50 y.o.   MRN: [id number]  HPI  Marie Whitney is a pleasant 50 year old [place] female, new to GI, referred today by Dr. Doyle Askew for evaluation of cholelithiasis. Patient relates that she has had GI evaluation done in [place] and has undergone prior endoscopies and colonoscopies. She says she was told that she had spasms in her colon and no other problems. She also believes she has a hiatal hernia. She was also told 20 years ago that she had gallstones and was treated for a period of time with Urso and says that her stones apparently dissolved. She says she had not had any trouble for many many years but had a recent episode of prolonged indigestion with epigastric discomfort in December 2016 and eventually went to the emergency room for evaluation of [facility]. She had labs done showing normal CBC and hepatic panel. Upper abdominal ultrasound showed multiple gallstones no gallbladder wall thickening, common bile duct 4 mm and liver within normal limits.  She says she has not had any problems since then but had been eating a lot of fatty foods around the holidays. She denies any problems with heartburn or indigestion currently. She mentions that she has also had problems throughout her life with very sporadic anal spasms which seem to wake her up from sleep area and she says these episodes are intense and usually lasts for about 15 minutes and then resolved. She had been having some problems with constipation but is focusing on high fiber and drinking lots of fluids and has also added MiraLAX and says it is working great. Patient tells me that she is currently having marital problems and does not want to go through any sort of a surgical procedure at this point unless she absolutely has to until she can get herself in a more  stable  position.  Review of Systems Pertinent positive and negative review of systems were noted in the above HPI section.  All other review of systems was otherwise negative.  Outpatient Encounter Prescriptions as of 02/12/2016  Medication Sig  . buPROPion (WELLBUTRIN XL) 150 MG 24 hr tablet TAKE 1 TABLET (150 MG TOTAL) BY MOUTH DAILY.  . cyclobenzaprine (FLEXERIL) 10 MG tablet Take 1 tablet (10 mg total) by mouth 3 (three) times daily as needed for muscle spasms.  . famotidine (PEPCID) 20 MG tablet Take 1 tablet (20 mg total) by mouth 2 (two) times daily.  . polyethylene glycol powder (GLYCOLAX/MIRALAX) powder Take 17 g by mouth daily.  . valACYclovir (VALTREX) 1000 MG tablet Take 1 tablet (1,000 mg total) by mouth daily.  . hyoscyamine (LEVSIN/SL) 0.125 MG SL tablet Place 1 tab on the tongue to dissolve, every 6 hours as needed for spasms.  . [DISCONTINUED] belladona alk-PHENObarbital (DONNATAL) 16.2 MG tablet Take 1 tablet by mouth every 8 (eight) hours as needed. (Patient not taking: Reported on 02/12/2016)  . [DISCONTINUED] HYDROcodone-acetaminophen (NORCO) 5-325 MG tablet Take 1 tablet by mouth every 4 (four) hours as needed for moderate pain. (Patient not taking: Reported on 02/12/2016)  . [DISCONTINUED] methocarbamol (ROBAXIN) 750 MG tablet Take 1 tablet (750 mg total) by mouth at bedtime as needed for muscle spasms. (Patient not taking: Reported on 02/12/2016)  . [DISCONTINUED] ondansetron (ZOFRAN) 4 MG tablet Take 1 tablet (4 mg total) by mouth daily as needed  for nausea or vomiting. (Patient not taking: Reported on 02/12/2016)  . [DISCONTINUED] temazepam (RESTORIL) 7.5 MG capsule Take 1 capsule (7.5 mg total) by mouth at bedtime as needed for sleep. (Patient not taking: Reported on 02/12/2016)   No facility-administered encounter medications on file as of 02/12/2016.   Allergies  Allergen Reactions  . Fluconazole Hives   Patient Active Problem List   Diagnosis Date Noted  . Anxiety disorder  01/12/2016  . Muscle spasm of back 11/12/2015  . Insomnia 11/12/2015  . Tinnitus of right ear 11/12/2015  . Hyperlipidemia 09/28/2015  . Constipation due to slow transit 09/28/2015  . Herpes simplex disease 09/28/2015  . Allergic rhinitis 09/28/2015  . Hx of abnormal mammogram 09/28/2015   Social History   Social History  . Marital Status: Married    Spouse Name: N/A  . Number of Children: N/A  . Years of Education: N/A   Occupational History  . Not on file.   Social History Main Topics  . Smoking status: Never Smoker   . Smokeless tobacco: Not on file  . Alcohol Use: 1.2 oz/week    2 Standard drinks or equivalent per week  . Drug Use: Not on file  . Sexual Activity: Not on file   Other Topics Concern  . Not on file   Social History Narrative    Ms. Marie Whitney family history includes CAD in her mother.      Objective:    Filed Vitals:   02/12/16 0831  BP: 124/84  Pulse: 86    Physical Exam    well-developed Hispanic female in no acute distress, pleasant blood pressure 124/84 pulse 86 height 5 foot 4 weight 218. BMI 37.4. HEENT ;nontraumatic normocephalic EOMI PERRLA sclera anicteric, Cardiovascular; regular rate and rhythm with S1-S2 no murmur or gallop, Pulmonary; clear bilaterally, Abdomen; large soft nontender nondistended bowel sounds are active there is no palpable last or hepatosplenomegaly, Rectal; exam not done, Extremities; no clubbing cyanosis or edema skin warm and dry, Neuropsych ;mood and affect appropriate     Assessment & Plan:   #1 50 yo [place]  female  With cholelithiasis - no current sxs , may have has an episode of biliary colic in December 0211 #2 Proctalgia fugax #3 hx IBS  Plan; Pt does not want to undergo surgery at this time unless absolutely necessary due to significant marital issues- she says she will be in a better position to consider lap chole in several months Have reviewed low fat diet , no ETOH Will send rx for levsin sl   For prn use for proctalgia episodes  She is aware that she should seek care through the ER should dhe develop any prolonged episodes of  Upper abdominal  Pain.nausea/vomiting  We discussed screening colonoscopy at age 29  She will plan on ROV with me in 6-8 months, or sooner prn  Pt will be established with Dr Fuller Plan   Alfredia Ferguson PA-C 02/12/2016   Cc: Glean Hess, MD

## 2016-02-12 NOTE — Patient Instructions (Signed)
Sent a prescription to [facility] [place], [place].  1. Levsin SL 0.125 mg.  We have provided a low fat diet brochure and Gallbladder removal information.

## 2016-03-22 ENCOUNTER — Ambulatory Visit: Payer: BLUE CROSS/BLUE SHIELD | Admitting: Gastroenterology

## 2016-11-09 ENCOUNTER — Other Ambulatory Visit: Payer: Self-pay | Admitting: Internal Medicine

## 2017-05-09 ENCOUNTER — Encounter: Payer: Self-pay | Admitting: Emergency Medicine

## 2017-05-09 ENCOUNTER — Ambulatory Visit
Admission: EM | Admit: 2017-05-09 | Discharge: 2017-05-09 | Disposition: A | Discharge status: Home / Self Care | Payer: BLUE CROSS/BLUE SHIELD | Attending: Family Medicine | Admitting: Family Medicine

## 2017-05-09 DIAGNOSIS — J209 Acute bronchitis, unspecified: Secondary | ICD-10-CM | POA: Diagnosis not present

## 2017-05-09 DIAGNOSIS — J4521 Mild intermittent asthma with (acute) exacerbation: Secondary | ICD-10-CM

## 2017-05-09 DIAGNOSIS — R05 Cough: Secondary | ICD-10-CM

## 2017-05-09 MED ORDER — ALBUTEROL SULFATE HFA 108 (90 BASE) MCG/ACT IN AERS: 1.0000 | INHALATION_SPRAY | Freq: Four times a day (QID) | RESPIRATORY_TRACT | 0 refills | Status: DC | PRN

## 2017-05-09 MED ORDER — IPRATROPIUM-ALBUTEROL 0.5-2.5 (3) MG/3ML IN SOLN
3.0000 mL | Freq: Once | RESPIRATORY_TRACT | Status: AC
  Administered 2017-05-09: 3 mL via RESPIRATORY_TRACT

## 2017-05-09 MED ORDER — GUAIFENESIN-CODEINE 100-10 MG/5ML PO SOLN: 10.0000 mL | Freq: Four times a day (QID) | ORAL | 0 refills | Status: DC | PRN

## 2017-05-09 MED ORDER — AZITHROMYCIN 250 MG PO TABS: ORAL_TABLET | ORAL | 0 refills | Status: DC

## 2017-05-09 MED ORDER — PREDNISONE 20 MG PO TABS: ORAL_TABLET | ORAL | 0 refills | Status: DC

## 2017-05-09 NOTE — ED Triage Notes (Signed)
Patient states she has been coughing for about 2 weeks

## 2017-05-09 NOTE — ED Provider Notes (Addendum)
[facility]    CSN: [id number] Arrival date & time: 05/09/17  0805     History   Chief Complaint Chief Complaint  Patient presents with  . Cough    HPI Marie Whitney is a 51 y.o. female.   The history is provided by the patient.  Cough  Associated symptoms: fever, headaches and wheezing   URI  Presenting symptoms: cough, fatigue and fever   Severity:  Moderate Onset quality:  Sudden Duration:  2 weeks Timing:  Constant Progression:  Worsening Chronicity:  New Relieved by:  None tried Worsened by:  Nothing Ineffective treatments:  None tried Associated symptoms: headaches and wheezing   Risk factors: chronic respiratory disease (mild, intermittent asthma) and recent illness   Risk factors: not elderly, no chronic cardiac disease, no chronic kidney disease, no immunosuppression, no recent travel and no sick contacts     History reviewed. No pertinent past medical history.  Patient Active Problem List   Diagnosis Date Noted  . Anxiety disorder 01/12/2016  . Muscle spasm of back 11/12/2015  . Insomnia 11/12/2015  . Tinnitus of right ear 11/12/2015  . Hyperlipidemia 09/28/2015  . Constipation due to slow transit 09/28/2015  . Herpes simplex disease 09/28/2015  . Allergic rhinitis 09/28/2015  . Hx of abnormal mammogram 09/28/2015    Past Surgical History:  Procedure Laterality Date  . none      OB History    No data available       Home Medications    Prior to Admission medications   Medication Sig Start Date End Date Taking? Authorizing Provider  buPROPion (WELLBUTRIN XL) 150 MG 24 hr tablet TAKE 1 TABLET (150 MG TOTAL) BY MOUTH DAILY. 12/15/15  Yes Reubin MilanBerglund, Laura H, MD  cyclobenzaprine (FLEXERIL) 10 MG tablet Take 1 tablet (10 mg total) by mouth 3 (three) times daily as needed for muscle spasms. 01/12/16  Yes Reubin MilanBerglund, Laura H, MD  hyoscyamine (LEVSIN/SL) 0.125 MG SL tablet Place 1 tab on the tongue to dissolve, every 6 hours as needed for  spasms. 02/12/16  Yes Esterwood, Amy S, PA-C  polyethylene glycol powder (GLYCOLAX/MIRALAX) powder Take 17 g by mouth daily. 11/12/15  Yes Reubin MilanBerglund, Laura H, MD  valACYclovir (VALTREX) 1000 MG tablet TAKE 1 TABLET (1,000 MG TOTAL) BY MOUTH DAILY. 11/09/16  Yes Reubin MilanBerglund, Laura H, MD  albuterol (PROVENTIL HFA;VENTOLIN HFA) 108 (90 Base) MCG/ACT inhaler Inhale 1-2 puffs into the lungs every 6 (six) hours as needed for wheezing or shortness of breath. 05/09/17   Payton Mccallumonty, Cobey Raineri, MD  azithromycin (ZITHROMAX Z-PAK) 250 MG tablet 2 tabs po once day 1, then 1 tab po qd for next 4 days 05/09/17   Payton Mccallumonty, Domnique Vantine, MD  famotidine (PEPCID) 20 MG tablet Take 1 tablet (20 mg total) by mouth 2 (two) times daily. 12/13/15 12/12/16  Darien RamusKaminski, David W, MD  guaiFENesin-codeine 100-10 MG/5ML syrup Take 10 mLs by mouth every 6 (six) hours as needed for cough. 05/09/17   Payton Mccallumonty, Tishawn Friedhoff, MD  predniSONE (DELTASONE) 20 MG tablet 3 tabs po qd for 2 days, then 2 tabs po qd for 3 days, then 1 tab po qd for 3 days, then half a tab po qd for 2 days 05/09/17   Payton Mccallumonty, Aziyah Provencal, MD    Family History Family History  Problem Relation Age of Onset  . CAD Mother     Social History Social History  Substance Use Topics  . Smoking status: Never Smoker  . Smokeless tobacco: Never Used  .  Alcohol use No     Allergies   Fluconazole   Review of Systems Review of Systems  Constitutional: Positive for fatigue and fever.  Respiratory: Positive for cough and wheezing.   Neurological: Positive for headaches.     Physical Exam Triage Vital Signs ED Triage Vitals  Enc Vitals Group     BP 05/09/17 0836 93/71     Pulse Rate 05/09/17 0836 84     Resp 05/09/17 0836 18     Temp 05/09/17 0836 98.1 F (36.7 C)     Temp Source 05/09/17 0836 Oral     SpO2 05/09/17 0836 99 %     Weight 05/09/17 0835 200 lb (90.7 kg)     Height 05/09/17 0835 5\' 4"  (1.626 m)     Head Circumference --      Peak Flow --      Pain Score 05/09/17 0839 2      Pain Loc --      Pain Edu? --      Excl. in GC? --    No data found.   Updated Vital Signs BP 93/71 (BP Location: Left Arm)   Pulse 84   Temp 98.1 F (36.7 C) (Oral)   Resp 18   Ht 5\' 4"  (1.626 m)   Wt 200 lb (90.7 kg)   LMP  (Within Years) Comment: 1 year ago  SpO2 99%   BMI 34.33 kg/m   Visual Acuity Right Eye Distance:   Left Eye Distance:   Bilateral Distance:    Right Eye Near:   Left Eye Near:    Bilateral Near:     Physical Exam  Constitutional: She appears well-developed and well-nourished. No distress.  HENT:  Head: Normocephalic and atraumatic.  Right Ear: Tympanic membrane, external ear and ear canal normal.  Left Ear: Tympanic membrane, external ear and ear canal normal.  Nose: No mucosal edema, rhinorrhea, nose lacerations, sinus tenderness, nasal deformity, septal deviation or nasal septal hematoma. No epistaxis.  No foreign bodies. Right sinus exhibits no maxillary sinus tenderness and no frontal sinus tenderness. Left sinus exhibits no maxillary sinus tenderness and no frontal sinus tenderness.  Mouth/Throat: Uvula is midline, oropharynx is clear and moist and mucous membranes are normal. No oropharyngeal exudate.  Eyes: Conjunctivae and EOM are normal. Pupils are equal, round, and reactive to light. Right eye exhibits no discharge. Left eye exhibits no discharge. No scleral icterus.  Neck: Normal range of motion. Neck supple. No tracheal deviation present. No thyromegaly present.  Cardiovascular: Normal rate, regular rhythm and normal heart sounds.   Pulmonary/Chest: Effort normal. No stridor. No respiratory distress. She has wheezes (diffusely and rhonchi). She has no rales.  Lymphadenopathy:    She has no cervical adenopathy.  Skin: She is not diaphoretic.  Nursing note and vitals reviewed.    UC Treatments / Results  Labs (all labs ordered are listed, but only abnormal results are displayed) Labs Reviewed - No data to display  EKG  EKG  Interpretation None       Radiology No results found.  Procedures Procedures (including critical care time)  Medications Ordered in UC Medications  ipratropium-albuterol (DUONEB) 0.5-2.5 (3) MG/3ML nebulizer solution 3 mL (3 mLs Nebulization Given 05/09/17 0853)     Initial Impression / Assessment and Plan / UC Course  I have reviewed the triage vital signs and the nursing notes.  Pertinent labs & imaging results that were available during my care of the patient were reviewed by me  and considered in my medical decision making (see chart for details).       Final Clinical Impressions(s) / UC Diagnoses   Final diagnoses:  Mild intermittent asthmatic bronchitis with acute exacerbation    New Prescriptions Discharge Medication List as of 05/09/2017  9:09 AM    START taking these medications   Details  albuterol (PROVENTIL HFA;VENTOLIN HFA) 108 (90 Base) MCG/ACT inhaler Inhale 1-2 puffs into the lungs every 6 (six) hours as needed for wheezing or shortness of breath., Starting Mon 05/09/2017, Normal    azithromycin (ZITHROMAX Z-PAK) 250 MG tablet 2 tabs po once day 1, then 1 tab po qd for next 4 days, Normal    guaiFENesin-codeine 100-10 MG/5ML syrup Take 10 mLs by mouth every 6 (six) hours as needed for cough., Starting Mon 05/09/2017, Print    predniSONE (DELTASONE) 20 MG tablet 3 tabs po qd for 2 days, then 2 tabs po qd for 3 days, then 1 tab po qd for 3 days, then half a tab po qd for 2 days, Normal       1. diagnosis reviewed with patient; given duoneb with improvement of symptoms 2. rx as per orders above; reviewed possible side effects, interactions, risks and benefits  3. Recommend supportive treatment with rest, fluids 4. Follow-up prn if symptoms worsen or don't improve   Payton Mccallum, MD 05/09/17 1610    Payton Mccallum, MD 05/09/17 [id number]

## 2017-09-13 ENCOUNTER — Encounter: Payer: Self-pay | Admitting: Internal Medicine

## 2017-09-13 ENCOUNTER — Ambulatory Visit (INDEPENDENT_AMBULATORY_CARE_PROVIDER_SITE_OTHER): Payer: BLUE CROSS/BLUE SHIELD | Admitting: Internal Medicine

## 2017-09-13 VITALS — BP 118/72 | HR 81 | Ht 64.0 in | Wt 223.0 lb

## 2017-09-13 DIAGNOSIS — M62830 Muscle spasm of back: Secondary | ICD-10-CM | POA: Diagnosis not present

## 2017-09-13 DIAGNOSIS — Z6838 Body mass index (BMI) 38.0-38.9, adult: Secondary | ICD-10-CM | POA: Diagnosis not present

## 2017-09-13 DIAGNOSIS — Z1239 Encounter for other screening for malignant neoplasm of breast: Secondary | ICD-10-CM

## 2017-09-13 DIAGNOSIS — Z1211 Encounter for screening for malignant neoplasm of colon: Secondary | ICD-10-CM

## 2017-09-13 DIAGNOSIS — B009 Herpesviral infection, unspecified: Secondary | ICD-10-CM

## 2017-09-13 DIAGNOSIS — K5901 Slow transit constipation: Secondary | ICD-10-CM | POA: Diagnosis not present

## 2017-09-13 DIAGNOSIS — E782 Mixed hyperlipidemia: Secondary | ICD-10-CM

## 2017-09-13 DIAGNOSIS — F411 Generalized anxiety disorder: Secondary | ICD-10-CM | POA: Diagnosis not present

## 2017-09-13 DIAGNOSIS — K219 Gastro-esophageal reflux disease without esophagitis: Secondary | ICD-10-CM | POA: Diagnosis not present

## 2017-09-13 DIAGNOSIS — Z0000 Encounter for general adult medical examination without abnormal findings: Secondary | ICD-10-CM

## 2017-09-13 LAB — POCT URINALYSIS DIPSTICK
Bilirubin, UA: NEGATIVE
Blood, UA: NEGATIVE
GLUCOSE UA: NEGATIVE
Ketones, UA: NEGATIVE
Leukocytes, UA: NEGATIVE
Nitrite, UA: NEGATIVE
PROTEIN UA: NEGATIVE
SPEC GRAV UA: 1.015 (ref 1.010–1.025)
UROBILINOGEN UA: 0.2 U/dL
pH, UA: 6 (ref 5.0–8.0)

## 2017-09-13 MED ORDER — VALACYCLOVIR HCL 1 G PO TABS: 1000.0000 mg | ORAL_TABLET | Freq: Every day | ORAL | 3 refills | Status: DC

## 2017-09-13 MED ORDER — POLYETHYLENE GLYCOL 3350 17 GM/SCOOP PO POWD: 17.0000 g | Freq: Every day | ORAL | 1 refills | Status: DC

## 2017-09-13 MED ORDER — CYCLOBENZAPRINE HCL 10 MG PO TABS: 10.0000 mg | ORAL_TABLET | Freq: Three times a day (TID) | ORAL | 1 refills | Status: DC | PRN

## 2017-09-13 MED ORDER — RANITIDINE HCL 150 MG PO TABS: 150.0000 mg | ORAL_TABLET | Freq: Two times a day (BID) | ORAL | 1 refills | Status: DC

## 2017-09-13 MED ORDER — PHENTERMINE HCL 37.5 MG PO CAPS: 37.5000 mg | ORAL_CAPSULE | ORAL | 1 refills | Status: DC

## 2017-09-13 MED ORDER — BUPROPION HCL ER (XL) 150 MG PO TB24: 150.0000 mg | ORAL_TABLET | Freq: Every day | ORAL | 1 refills | Status: DC

## 2017-09-13 MED ORDER — HYOSCYAMINE SULFATE 0.125 MG SL SUBL: SUBLINGUAL_TABLET | SUBLINGUAL | 1 refills | Status: DC

## 2017-09-13 NOTE — Patient Instructions (Signed)

## 2017-09-13 NOTE — Progress Notes (Signed)
Date:  09/13/2017   Name:  Marie Whitney   DOB:  02-16-1966   MRN:  [id number]   Chief Complaint: Annual Exam (Breast Exam and Pap smear. ) and Depression (PHQ9- 18. Having problems with husband. )  Marie Whitney is a 50 y.o. female who presents today for her Complete Annual Exam. She feels fairly well. She reports exercising walking when she can. She reports she is sleeping fairly well.  Patient reports having more depression and anxiety symptoms. Currently living with her husband in a fairly isolated area. She does not have a driver's license so she cannot get out of the house. In addition she probably also does not have a green card which would allow her to stay in the country. Her family is in [place] and are in need of her help financially which she has been providing to some degree. However she does feel trapped as her husband does not allow her much freedom. She is also concerned that if she does not obtain a green card she will be deported.   Depression         This is a chronic problem.  Associated symptoms include appetite change.  Associated symptoms include no fatigue, no headaches and no suicidal ideas.     The symptoms are aggravated by family issues.  Past treatments include other medications (bupropion).  Compliance with treatment is good.  Previous treatment provided mild relief.  Risk factors include marital problems.  Gastroesophageal Reflux  She complains of heartburn. She reports no abdominal pain, no chest pain, no coughing or no wheezing. The problem occurs occasionally. Pertinent negatives include no fatigue. She has tried a histamine-2 antagonist for the symptoms.  Back Pain  This is a recurrent problem. The problem has been waxing and waning since onset. The pain is present in the lumbar spine. The pain is moderate. The symptoms are aggravated by twisting, stress and bending. Pertinent negatives include no abdominal pain, chest pain, dysuria, fever or headaches. Risk  factors include obesity. She has tried muscle relaxant for the symptoms. The treatment provided moderate relief.  Tinnitus/hearing loss - patient's husband believes that she has some decreased hearing. She does report intermittent tinnitus in the right ear. No pain or drainage. She feels that her hearing is relatively normal but that her husband will often mumble making it difficult for her to understand.    Review of Systems  Constitutional: Positive for appetite change. Negative for chills, fatigue and fever.  HENT: Negative for congestion, hearing loss, tinnitus, trouble swallowing and voice change.   Eyes: Negative for visual disturbance.  Respiratory: Negative for cough, chest tightness, shortness of breath and wheezing.   Cardiovascular: Negative for chest pain, palpitations and leg swelling.  Gastrointestinal: Positive for heartburn. Negative for abdominal pain, constipation, diarrhea and vomiting.  Endocrine: Negative for polydipsia and polyuria.  Genitourinary: Negative for dysuria, frequency, genital sores, vaginal bleeding and vaginal discharge.  Musculoskeletal: Positive for back pain. Negative for arthralgias, gait problem and joint swelling.  Skin: Negative for color change and rash.  Neurological: Negative for dizziness, tremors, light-headedness and headaches.  Hematological: Negative for adenopathy. Does not bruise/bleed easily.  Psychiatric/Behavioral: Positive for depression. Negative for dysphoric mood, sleep disturbance and suicidal ideas. The patient is not nervous/anxious.     Patient Active Problem List   Diagnosis Date Noted  . Anxiety disorder 01/12/2016  . Muscle spasm of back 11/12/2015  . Insomnia 11/12/2015  . Tinnitus of right ear 11/12/2015  .  Mixed hyperlipidemia 09/28/2015  . Constipation due to slow transit 09/28/2015  . Herpes simplex disease 09/28/2015  . Allergic rhinitis 09/28/2015  . Hx of abnormal mammogram 09/28/2015    Prior to Admission  medications   Medication Sig Start Date End Date Taking? Authorizing Provider  albuterol (PROVENTIL HFA;VENTOLIN HFA) 108 (90 Base) MCG/ACT inhaler Inhale 1-2 puffs into the lungs every 6 (six) hours as needed for wheezing or shortness of breath. 05/09/17  Yes Conty, Orlando, MD  buPROPion (WELLBUTRIN XL) 150 MG 24 hr tablet TAKE 1 TABLET (150 MG TOTAL) BY MOUTH DAILY. 12/15/15  Yes Reubin Milan, MD  cyclobenzaprine (FLEXERIL) 10 MG tablet Take 1 tablet (10 mg total) by mouth 3 (three) times daily as needed for muscle spasms. 01/12/16  Yes Reubin Milan, MD  hyoscyamine (LEVSIN/SL) 0.125 MG SL tablet Place 1 tab on the tongue to dissolve, every 6 hours as needed for spasms. 02/12/16  Yes Esterwood, Amy S, PA-C  polyethylene glycol powder (GLYCOLAX/MIRALAX) powder Take 17 g by mouth daily. 11/12/15  Yes Reubin Milan, MD  valACYclovir (VALTREX) 1000 MG tablet TAKE 1 TABLET (1,000 MG TOTAL) BY MOUTH DAILY. 11/09/16  Yes Reubin Milan, MD    No Active Allergies  Past Surgical History:  Procedure Laterality Date  . none      Social History  Substance Use Topics  . Smoking status: Never Smoker  . Smokeless tobacco: Never Used  . Alcohol use No     Medication list has been reviewed and updated.  PHQ 2/9 Scores 09/13/2017  PHQ - 2 Score 2  PHQ- 9 Score 18    Physical Exam  Constitutional: She is oriented to person, place, and time. She appears well-developed and well-nourished. No distress.  HENT:  Head: Normocephalic and atraumatic.  Right Ear: Tympanic membrane and ear canal normal. Tympanic membrane is not erythematous and not retracted. No decreased hearing is noted.  Left Ear: Tympanic membrane and ear canal normal. Tympanic membrane is not erythematous and not retracted. No decreased hearing is noted.  Nose: Right sinus exhibits no maxillary sinus tenderness. Left sinus exhibits no maxillary sinus tenderness.  Mouth/Throat: Uvula is midline and oropharynx is clear and  moist.  Eyes: Conjunctivae and EOM are normal. Right eye exhibits no discharge. Left eye exhibits no discharge. No scleral icterus.  Neck: Normal range of motion. Carotid bruit is not present. No erythema present. No thyromegaly present.  Cardiovascular: Normal rate, regular rhythm, normal heart sounds and normal pulses.   Pulmonary/Chest: Effort normal. No respiratory distress. She has no wheezes. Right breast exhibits no mass, no nipple discharge, no skin change and no tenderness. Left breast exhibits no mass, no nipple discharge, no skin change and no tenderness.  Abdominal: Soft. Bowel sounds are normal. There is no hepatosplenomegaly. There is no tenderness. There is no CVA tenderness.  Musculoskeletal: Normal range of motion. She exhibits no edema.       Lumbar back: She exhibits spasm. She exhibits no bony tenderness and no deformity.  Lymphadenopathy:    She has no cervical adenopathy.    She has no axillary adenopathy.  Neurological: She is alert and oriented to person, place, and time. She has normal reflexes. No cranial nerve deficit or sensory deficit.  Skin: Skin is warm, dry and intact. No rash noted.  Psychiatric: She has a normal mood and affect. Her speech is normal and behavior is normal. Thought content normal.  Nursing note and vitals reviewed.   BP 118/72  Pulse 81   Ht  (1.626 m)   Wt 223 lb (101.2 kg)   SpO2 98%   BMI 38.28 kg/m   Assessment and Plan: 1. Annual physical exam - CBC with Differential/Platelet - POCT urinalysis dipstick - HIV antibody - GC/Chlamydia Probe Amp - RPR  2. Breast cancer screening - MM DIGITAL SCREENING BILATERAL; Future  3. Mixed hyperlipidemia - Comprehensive metabolic panel - Lipid panel - TSH  4. Herpes simplex disease - valACYclovir (VALTREX) 1000 MG tablet; Take 1 tablet (1,000 mg total) by mouth daily.  Dispense: 90 tablet; Refill: 3  5. Generalized anxiety disorder Continue current medication Sx worsened by  sense of being trapped at home with no outlet or means of independence, no family or other social support system Pt urged to follow up if needed - buPROPion (WELLBUTRIN XL) 150 MG 24 hr tablet; Take 1 tablet (150 mg total) by mouth daily.  Dispense: 90 tablet; Refill: 1  6. Colon cancer screening - Ambulatory referral to Gastroenterology  7. BMI 38.0-38.9,adult Needs to help control appetite - phentermine 37.5 MG capsule; Take 1 capsule (37.5 mg total) by mouth every morning.  Dispense: 30 capsule; Refill: 1  8. Constipation due to slow transit - polyethylene glycol powder (GLYCOLAX/MIRALAX) powder; Take 17 g by mouth daily.  Dispense: 3350 g; Refill: 1 - hyoscyamine (LEVSIN SL) 0.125 MG SL tablet; Place 1 tab on the tongue to dissolve, every 6 hours as needed for spasms.  Dispense: 90 tablet; Refill: 1  9. Muscle spasm of back Stable, intermittent - cyclobenzaprine (FLEXERIL) 10 MG tablet; Take 1 tablet (10 mg total) by mouth 3 (three) times daily as needed for muscle spasms.  Dispense: 270 tablet; Refill: 1  10. Gastroesophageal reflux disease, esophagitis presence not specified Controlled on H2 blocker - ranitidine (ZANTAC) 150 MG tablet; Take 1 tablet (150 mg total) by mouth 2 (two) times daily.  Dispense: 180 tablet; Refill: 1   Meds ordered this encounter  Medications  . phentermine 37.5 MG capsule    Sig: Take 1 capsule (37.5 mg total) by mouth every morning.    Dispense:  30 capsule    Refill:  1  . valACYclovir (VALTREX) 1000 MG tablet    Sig: Take 1 tablet (1,000 mg total) by mouth daily.    Dispense:  90 tablet    Refill:  3  . polyethylene glycol powder (GLYCOLAX/MIRALAX) powder    Sig: Take 17 g by mouth daily.    Dispense:  3350 g    Refill:  1  . buPROPion (WELLBUTRIN XL) 150 MG 24 hr tablet    Sig: Take 1 tablet (150 mg total) by mouth daily.    Dispense:  90 tablet    Refill:  1  . cyclobenzaprine (FLEXERIL) 10 MG tablet    Sig: Take 1 tablet (10 mg total)  by mouth 3 (three) times daily as needed for muscle spasms.    Dispense:  270 tablet    Refill:  1  . hyoscyamine (LEVSIN SL) 0.125 MG SL tablet    Sig: Place 1 tab on the tongue to dissolve, every 6 hours as needed for spasms.    Dispense:  90 tablet    Refill:  1  . ranitidine (ZANTAC) 150 MG tablet    Sig: Take 1 tablet (150 mg total) by mouth 2 (two) times daily.    Dispense:  180 tablet    Refill:  1    Partially dictated using Animal nutritionist. Any  errors are unintentional.  Bari Edward, MD [facility]  09/13/2017

## 2017-09-14 LAB — COMPREHENSIVE METABOLIC PANEL
A/G RATIO: 1.3 (ref 1.2–2.2)
ALBUMIN: 4.7 g/dL (ref 3.5–5.5)
ALK PHOS: 86 IU/L (ref 39–117)
ALT: 44 IU/L — ABNORMAL HIGH (ref 0–32)
AST: 40 IU/L (ref 0–40)
BILIRUBIN TOTAL: 0.2 mg/dL (ref 0.0–1.2)
BUN / CREAT RATIO: 21 (ref 9–23)
BUN: 12 mg/dL (ref 6–24)
CHLORIDE: 104 mmol/L (ref 96–106)
CO2: 21 mmol/L (ref 20–29)
CREATININE: 0.57 mg/dL (ref 0.57–1.00)
Calcium: 9.5 mg/dL (ref 8.7–10.2)
GFR calc Af Amer: 124 mL/min/{1.73_m2} (ref >59)
GFR calc non Af Amer: 108 mL/min/{1.73_m2} (ref >59)
GLOBULIN, TOTAL: 3.6 g/dL (ref 1.5–4.5)
Glucose: 108 mg/dL — ABNORMAL HIGH (ref 65–99)
POTASSIUM: 4.5 mmol/L (ref 3.5–5.2)
SODIUM: 142 mmol/L (ref 134–144)
Total Protein: 8.3 g/dL (ref 6.0–8.5)

## 2017-09-14 LAB — TSH: TSH: 1.9 u[IU]/mL (ref 0.450–4.500)

## 2017-09-14 LAB — CBC WITH DIFFERENTIAL/PLATELET
Basophils Absolute: 0 10*3/uL (ref 0.0–0.2)
Basos: 0 %
EOS (ABSOLUTE): 0.1 10*3/uL (ref 0.0–0.4)
EOS: 2 %
HEMATOCRIT: 41 % (ref 34.0–46.6)
HEMOGLOBIN: 13.6 g/dL (ref 11.1–15.9)
Immature Grans (Abs): 0 10*3/uL (ref 0.0–0.1)
Immature Granulocytes: 0 %
LYMPHS ABS: 1.7 10*3/uL (ref 0.7–3.1)
Lymphs: 28 %
MCH: 27.9 pg (ref 26.6–33.0)
MCHC: 33.2 g/dL (ref 31.5–35.7)
MCV: 84 fL (ref 79–97)
Monocytes Absolute: 0.3 10*3/uL (ref 0.1–0.9)
Monocytes: 5 %
NEUTROS ABS: 3.9 10*3/uL (ref 1.4–7.0)
Neutrophils: 65 %
Platelets: 367 10*3/uL (ref 150–379)
RBC: 4.87 x10E6/uL (ref 3.77–5.28)
RDW: 13.3 % (ref 12.3–15.4)
WBC: 6 10*3/uL (ref 3.4–10.8)

## 2017-09-14 LAB — RPR: RPR: NONREACTIVE

## 2017-09-14 LAB — LIPID PANEL
CHOLESTEROL TOTAL: 209 mg/dL — AB (ref 100–199)
Chol/HDL Ratio: 5 ratio — ABNORMAL HIGH (ref 0.0–4.4)
HDL: 42 mg/dL (ref >39)
LDL CALC: 133 mg/dL — AB (ref 0–99)
Triglycerides: 168 mg/dL — ABNORMAL HIGH (ref 0–149)
VLDL Cholesterol Cal: 34 mg/dL (ref 5–40)

## 2017-09-14 LAB — HIV ANTIBODY (ROUTINE TESTING W REFLEX): HIV SCREEN 4TH GENERATION: NONREACTIVE

## 2017-09-16 ENCOUNTER — Telehealth: Payer: Self-pay

## 2017-09-16 ENCOUNTER — Other Ambulatory Visit: Payer: Self-pay

## 2017-09-16 ENCOUNTER — Other Ambulatory Visit: Payer: Self-pay | Admitting: Internal Medicine

## 2017-09-16 DIAGNOSIS — J309 Allergic rhinitis, unspecified: Secondary | ICD-10-CM

## 2017-09-16 MED ORDER — FEXOFENADINE HCL 180 MG PO TABS: 180.0000 mg | ORAL_TABLET | Freq: Every day | ORAL | 5 refills | Status: DC

## 2017-09-16 MED ORDER — FEXOFENADINE HCL 180 MG PO TABS: 180.0000 mg | ORAL_TABLET | Freq: Every day | ORAL | 3 refills | Status: AC

## 2017-09-16 NOTE — Telephone Encounter (Signed)
Patient called stating she was wanting to get prescription sent to [facility] in [place] for Allegra and Temazepam. She stated she needs to get allegra for Rx because of her husband and the temazepam she has used previously for anxiety and insomnia. It does show in history. But she is unsure if her husband will let her come here for her follow up in two months.   Please Advise.

## 2017-09-16 NOTE — Telephone Encounter (Signed)
I will not prescribe medication without documenting discussion during a visit.  I also will not prescribe controlled medication if she is not going to be able to follow up. I will send in the Allegra - this can be purchased over the counter as well.

## 2017-09-26 ENCOUNTER — Telehealth: Payer: Self-pay

## 2017-09-26 NOTE — Telephone Encounter (Signed)
Patient called stating she will only be able to get calls in the next two hours from her VM. I was not able to reach her before 2 hours was up. She does not want a callback due to her husband being home. She stated she will call again tomorrow. Awaiting patients call to discuss labs.

## 2018-03-18 ENCOUNTER — Other Ambulatory Visit: Payer: Self-pay | Admitting: Internal Medicine

## 2018-03-18 DIAGNOSIS — F411 Generalized anxiety disorder: Secondary | ICD-10-CM

## 2018-04-07 ENCOUNTER — Other Ambulatory Visit: Payer: Self-pay | Admitting: Internal Medicine

## 2018-04-07 DIAGNOSIS — K219 Gastro-esophageal reflux disease without esophagitis: Secondary | ICD-10-CM

## 2018-04-07 DIAGNOSIS — M62830 Muscle spasm of back: Secondary | ICD-10-CM

## 2018-11-05 ENCOUNTER — Other Ambulatory Visit: Payer: Self-pay | Admitting: Internal Medicine

## 2018-11-05 DIAGNOSIS — K219 Gastro-esophageal reflux disease without esophagitis: Secondary | ICD-10-CM

## 2018-11-05 DIAGNOSIS — F411 Generalized anxiety disorder: Secondary | ICD-10-CM

## 2018-11-05 DIAGNOSIS — M62830 Muscle spasm of back: Secondary | ICD-10-CM

## 2018-11-21 ENCOUNTER — Ambulatory Visit: Payer: BLUE CROSS/BLUE SHIELD | Admitting: Internal Medicine

## 2018-11-21 ENCOUNTER — Encounter: Payer: Self-pay | Admitting: Internal Medicine

## 2018-11-21 VITALS — BP 112/78 | HR 74 | Ht 64.0 in | Wt 228.0 lb

## 2018-11-21 DIAGNOSIS — K5901 Slow transit constipation: Secondary | ICD-10-CM

## 2018-11-21 DIAGNOSIS — J4520 Mild intermittent asthma, uncomplicated: Secondary | ICD-10-CM | POA: Diagnosis not present

## 2018-11-21 DIAGNOSIS — H9313 Tinnitus, bilateral: Secondary | ICD-10-CM

## 2018-11-21 DIAGNOSIS — F411 Generalized anxiety disorder: Secondary | ICD-10-CM | POA: Diagnosis not present

## 2018-11-21 DIAGNOSIS — Z6839 Body mass index (BMI) 39.0-39.9, adult: Secondary | ICD-10-CM

## 2018-11-21 DIAGNOSIS — F5101 Primary insomnia: Secondary | ICD-10-CM

## 2018-11-21 DIAGNOSIS — Z23 Encounter for immunization: Secondary | ICD-10-CM | POA: Diagnosis not present

## 2018-11-21 DIAGNOSIS — K219 Gastro-esophageal reflux disease without esophagitis: Secondary | ICD-10-CM

## 2018-11-21 DIAGNOSIS — B009 Herpesviral infection, unspecified: Secondary | ICD-10-CM

## 2018-11-21 MED ORDER — TEMAZEPAM 7.5 MG PO CAPS: 7.5000 mg | ORAL_CAPSULE | Freq: Every evening | ORAL | 0 refills | Status: DC | PRN

## 2018-11-21 MED ORDER — VALACYCLOVIR HCL 1 G PO TABS: 1000.0000 mg | ORAL_TABLET | Freq: Every day | ORAL | 3 refills | Status: DC

## 2018-11-21 MED ORDER — POLYETHYLENE GLYCOL 3350 17 GM/SCOOP PO POWD: 17.0000 g | Freq: Every day | ORAL | 1 refills | Status: DC

## 2018-11-21 MED ORDER — CIMETIDINE 400 MG PO TABS: 400.0000 mg | ORAL_TABLET | Freq: Two times a day (BID) | ORAL | 0 refills | Status: DC

## 2018-11-21 MED ORDER — PHENTERMINE HCL 37.5 MG PO CAPS: 37.5000 mg | ORAL_CAPSULE | ORAL | 1 refills | Status: DC

## 2018-11-21 NOTE — Patient Instructions (Signed)

## 2018-11-21 NOTE — Progress Notes (Signed)
Date:  11/21/2018   Name:  Marie Whitney   DOB:  [birth year]   MRN:  [id number]   Chief Complaint: Gastroesophageal Reflux (Want to know if we can call in diff meds other than Zantac- 90 days ); Insomnia (Takes a long time to fall asleep. Sleeping 2-4 hours at night. Temazepam. ); and Asthma (Wants to discuss getting pneumonia vaccine. )  Gastroesophageal Reflux  She complains of heartburn. She reports no abdominal pain, no chest pain or no coughing. This is a recurrent problem. The problem occurs frequently. Pertinent negatives include no fatigue. She has tried a histamine-2 antagonist for the symptoms. The treatment provided moderate relief.  Asthma  She complains of shortness of breath. There is no cough. Associated symptoms include heartburn. Pertinent negatives include no appetite change, chest pain, fever, headaches or trouble swallowing. Her past medical history is significant for asthma.  Anxiety  Presents for follow-up visit. Symptoms include decreased concentration, depressed mood, excessive worry, insomnia, nervous/anxious behavior, shortness of breath and suicidal ideas. Patient reports no chest pain or palpitations. Symptoms occur most days. The severity of symptoms is causing significant distress. The quality of sleep is poor.   Her past medical history is significant for asthma.  Insomnia  Primary symptoms: sleep disturbance, difficulty falling asleep, premature morning awakening.  Past treatments include medication. The treatment provided significant relief.  Tinnitus - in both ears but worse on the right and possible hearing loss.  She has trouble hearing her husband at times.  No pain or drainage, no trauma.   Review of Systems  Constitutional: Negative for appetite change, fatigue, fever and unexpected weight change.  HENT: Positive for hearing loss and tinnitus. Negative for trouble swallowing.   Eyes: Negative for visual disturbance.  Respiratory: Positive for  shortness of breath. Negative for cough and chest tightness.   Cardiovascular: Negative for chest pain, palpitations and leg swelling.  Gastrointestinal: Positive for constipation and heartburn. Negative for abdominal pain.  Endocrine: Negative for polydipsia and polyuria.  Genitourinary: Negative for dysuria and hematuria.  Musculoskeletal: Negative for arthralgias.  Skin: Negative for color change and wound.  Allergic/Immunologic: Positive for environmental allergies.  Neurological: Negative for tremors, numbness and headaches.  Hematological: Negative for adenopathy.  Psychiatric/Behavioral: Positive for decreased concentration, sleep disturbance and suicidal ideas. Negative for dysphoric mood. The patient is nervous/anxious and has insomnia.     Patient Active Problem List   Diagnosis Date Noted  . Anxiety disorder 01/12/2016  . Muscle spasm of back 11/12/2015  . Insomnia 11/12/2015  . Tinnitus of right ear 11/12/2015  . Mixed hyperlipidemia 09/28/2015  . Constipation due to slow transit 09/28/2015  . Herpes simplex disease 09/28/2015  . Allergic rhinitis 09/28/2015  . Hx of abnormal mammogram 09/28/2015    No Known Allergies  Past Surgical History:  Procedure Laterality Date  . none      Social History   Tobacco Use  . Smoking status: Never Smoker  . Smokeless tobacco: Never Used  Substance Use Topics  . Alcohol use: No    Alcohol/week: 2.0 standard drinks    Types: 2 Standard drinks or equivalent per week  . Drug use: Not on file     Medication list has been reviewed and updated.  Current Meds  Medication Sig  . albuterol (PROVENTIL HFA;VENTOLIN HFA) 108 (90 Base) MCG/ACT inhaler Inhale 1-2 puffs into the lungs every 6 (six) hours as needed for wheezing or shortness of breath.  Marland Kitchen buPROPion (WELLBUTRIN XL) 150 MG  24 hr tablet TAKE 1 TABLET BY MOUTH EVERY DAY  . cyclobenzaprine (FLEXERIL) 10 MG tablet TAKE 1 TABLET BY MOUTH THREE TIMES A DAY AS NEEDED FOR  MUSCLE SPASMS  . fexofenadine (ALLEGRA ALLERGY) 180 MG tablet Take 1 tablet (180 mg total) by mouth daily.  . hyoscyamine (LEVSIN SL) 0.125 MG SL tablet Place 1 tab on the tongue to dissolve, every 6 hours as needed for spasms.  . phentermine 37.5 MG capsule Take 1 capsule (37.5 mg total) by mouth every morning.  . polyethylene glycol powder (GLYCOLAX/MIRALAX) powder Take 17 g by mouth daily.  . ranitidine (ZANTAC) 150 MG tablet TAKE 1 TABLET BY MOUTH TWICE A DAY  . valACYclovir (VALTREX) 1000 MG tablet Take 1 tablet (1,000 mg total) by mouth daily.    PHQ 2/9 Scores 11/21/2018 09/13/2017  PHQ - 2 Score 6 2  PHQ- 9 Score 24 18    Physical Exam  Constitutional: She is oriented to person, place, and time. She appears well-developed. No distress.  HENT:  Head: Normocephalic and atraumatic.  Right Ear: Tympanic membrane and ear canal normal.  Left Ear: Tympanic membrane and ear canal normal.  Nose: Right sinus exhibits no maxillary sinus tenderness and no frontal sinus tenderness. Left sinus exhibits no maxillary sinus tenderness and no frontal sinus tenderness.  Mouth/Throat: No posterior oropharyngeal erythema.  Neck: Normal range of motion. Neck supple.  Cardiovascular: Normal rate, regular rhythm and normal heart sounds.  Pulmonary/Chest: Effort normal and breath sounds normal. No respiratory distress.  Abdominal: Soft. Normal appearance and bowel sounds are normal. There is no tenderness. There is no rigidity and no guarding.  Musculoskeletal: Normal range of motion.  Lymphadenopathy:    She has no cervical adenopathy.  Neurological: She is alert and oriented to person, place, and time.  Skin: Skin is warm and dry. No rash noted.  Psychiatric: Her behavior is normal. Thought content normal. Cognition and memory are normal. She exhibits a depressed mood. She expresses no suicidal plans and no homicidal plans.  Nursing note and vitals reviewed.  Wt Readings from Last 3 Encounters:    11/21/18 228 lb (103.4 kg)  09/13/17 223 lb (101.2 kg)  05/09/17 200 lb (90.7 kg)    BP 112/78 (BP Location: Right Arm, Patient Position: Sitting, Cuff Size: Large)   Pulse 74   Ht 5\' 4"  (1.626 m)   Wt 228 lb (103.4 kg)   SpO2 98%   BMI 39.14 kg/m   Assessment and Plan: 1. Generalized anxiety disorder Continue bupropion  2. BMI 39.0-39.9,adult Will 2 months of meds then will need follow up for BP and wt check - phentermine 37.5 MG capsule; Take 1 capsule (37.5 mg total) by mouth every morning.  Dispense: 30 capsule; Refill: 1  3. Mild intermittent asthma without complication Stable, no current infection  4. Primary insomnia - temazepam (RESTORIL) 7.5 MG capsule; Take 1 capsule (7.5 mg total) by mouth at bedtime as needed for sleep.  Dispense: 90 capsule; Refill: 0  5. Constipation due to slow transit - Comprehensive metabolic panel - TSH - polyethylene glycol powder (GLYCOLAX/MIRALAX) powder; Take 17 g by mouth daily.  Dispense: 3350 g; Refill: 1  6. Herpes simplex disease - valACYclovir (VALTREX) 1000 MG tablet; Take 1 tablet (1,000 mg total) by mouth daily.  Dispense: 90 tablet; Refill: 3  7. Insomnia - temazepam (RESTORIL) 7.5 MG capsule; Take 1 capsule (7.5 mg total) by mouth at bedtime as needed for sleep.  Dispense: 90 capsule; Refill: 0  8. Gastroesophageal reflux disease, esophagitis presence not specified Will start Tagamet since zantac recalled - CBC with Differential/Platelet - cimetidine (TAGAMET) 400 MG tablet; Take 1 tablet (400 mg total) by mouth 2 (two) times daily.  Dispense: 180 tablet; Refill: 0  9. Need for vaccination for pneumococcus - Pneumococcal polysaccharide vaccine 23-valent greater than or equal to 2yo subcutaneous/IM  10. Tinnitus of both ears - Ambulatory referral to ENT   Partially dictated using Dragon software. Any errors are unintentional.  Bari Edward, MD [facility]  11/21/2018

## 2018-11-22 LAB — COMPREHENSIVE METABOLIC PANEL
A/G RATIO: 1.5 (ref 1.2–2.2)
ALT: 36 IU/L — AB (ref 0–32)
AST: 30 IU/L (ref 0–40)
Albumin: 4.7 g/dL (ref 3.5–5.5)
Alkaline Phosphatase: 87 IU/L (ref 39–117)
BILIRUBIN TOTAL: 0.5 mg/dL (ref 0.0–1.2)
BUN / CREAT RATIO: 22 (ref 9–23)
BUN: 16 mg/dL (ref 6–24)
CO2: 25 mmol/L (ref 20–29)
Calcium: 9.4 mg/dL (ref 8.7–10.2)
Chloride: 98 mmol/L (ref 96–106)
Creatinine, Ser: 0.74 mg/dL (ref 0.57–1.00)
GFR calc non Af Amer: 93 mL/min/{1.73_m2} (ref >59)
GFR, EST AFRICAN AMERICAN: 108 mL/min/{1.73_m2} (ref >59)
Globulin, Total: 3.1 g/dL (ref 1.5–4.5)
Glucose: 86 mg/dL (ref 65–99)
Potassium: 3.8 mmol/L (ref 3.5–5.2)
Sodium: 140 mmol/L (ref 134–144)
TOTAL PROTEIN: 7.8 g/dL (ref 6.0–8.5)

## 2018-11-22 LAB — TSH: TSH: 1.64 u[IU]/mL (ref 0.450–4.500)

## 2018-11-22 LAB — CBC WITH DIFFERENTIAL/PLATELET
Basophils Absolute: 0 10*3/uL (ref 0.0–0.2)
Basos: 1 %
EOS (ABSOLUTE): 0.1 10*3/uL (ref 0.0–0.4)
Eos: 2 %
HEMATOCRIT: 40.3 % (ref 34.0–46.6)
HEMOGLOBIN: 13.5 g/dL (ref 11.1–15.9)
IMMATURE GRANS (ABS): 0 10*3/uL (ref 0.0–0.1)
IMMATURE GRANULOCYTES: 0 %
Lymphocytes Absolute: 1.5 10*3/uL (ref 0.7–3.1)
Lymphs: 29 %
MCH: 28.1 pg (ref 26.6–33.0)
MCHC: 33.5 g/dL (ref 31.5–35.7)
MCV: 84 fL (ref 79–97)
MONOCYTES: 6 %
MONOS ABS: 0.3 10*3/uL (ref 0.1–0.9)
Neutrophils Absolute: 3.2 10*3/uL (ref 1.4–7.0)
Neutrophils: 62 %
Platelets: 348 10*3/uL (ref 150–450)
RBC: 4.81 x10E6/uL (ref 3.77–5.28)
RDW: 12.8 % (ref 12.3–15.4)
WBC: 5.2 10*3/uL (ref 3.4–10.8)

## 2018-12-14 ENCOUNTER — Other Ambulatory Visit: Payer: Self-pay | Admitting: Internal Medicine

## 2018-12-14 DIAGNOSIS — F411 Generalized anxiety disorder: Secondary | ICD-10-CM

## 2018-12-14 DIAGNOSIS — K219 Gastro-esophageal reflux disease without esophagitis: Secondary | ICD-10-CM

## 2018-12-14 DIAGNOSIS — M62830 Muscle spasm of back: Secondary | ICD-10-CM

## 2018-12-15 ENCOUNTER — Other Ambulatory Visit: Payer: Self-pay | Admitting: Internal Medicine

## 2018-12-15 DIAGNOSIS — K5901 Slow transit constipation: Secondary | ICD-10-CM

## 2018-12-15 NOTE — Telephone Encounter (Signed)
Has transportation issues, will have to call back to set up appointment

## 2019-02-26 ENCOUNTER — Other Ambulatory Visit: Payer: Self-pay | Admitting: Internal Medicine

## 2019-02-26 DIAGNOSIS — K219 Gastro-esophageal reflux disease without esophagitis: Secondary | ICD-10-CM

## 2019-03-02 ENCOUNTER — Telehealth: Payer: Self-pay | Admitting: Internal Medicine

## 2019-03-02 NOTE — Telephone Encounter (Signed)
Patient is requesting med refills. I left a message to call back on which ones she want to call into pharmacy.

## 2019-03-02 NOTE — Telephone Encounter (Signed)
Awaiting cb to speak with patient to see what she needs refilled.

## 2019-03-06 ENCOUNTER — Telehealth: Payer: Self-pay | Admitting: Internal Medicine

## 2019-03-06 NOTE — Telephone Encounter (Signed)
This is a medication that requires a follow up. She will need to make a follow up appt before having a refill. Please call patient and schedule.

## 2019-03-06 NOTE — Telephone Encounter (Signed)
Patient called back and is requesting med refill for  phentermine 37.5 MG capsule [[id number]] and temazepam (RESTORIL) 7.5 MG capsule [[id number]  [id number] - [place], [place] - [address] [place] #:  [id number]

## 2019-03-13 ENCOUNTER — Other Ambulatory Visit: Payer: Self-pay

## 2019-03-14 ENCOUNTER — Ambulatory Visit: Payer: Self-pay | Admitting: Internal Medicine

## 2019-03-15 ENCOUNTER — Other Ambulatory Visit: Payer: Self-pay

## 2019-03-15 ENCOUNTER — Encounter: Payer: Self-pay | Admitting: Internal Medicine

## 2019-03-15 ENCOUNTER — Ambulatory Visit (INDEPENDENT_AMBULATORY_CARE_PROVIDER_SITE_OTHER): Payer: BLUE CROSS/BLUE SHIELD | Admitting: Internal Medicine

## 2019-03-15 VITALS — BP 120/80 | Resp 16 | Ht 64.0 in | Wt 220.0 lb

## 2019-03-15 DIAGNOSIS — F5101 Primary insomnia: Secondary | ICD-10-CM | POA: Diagnosis not present

## 2019-03-15 DIAGNOSIS — F411 Generalized anxiety disorder: Secondary | ICD-10-CM

## 2019-03-15 DIAGNOSIS — K219 Gastro-esophageal reflux disease without esophagitis: Secondary | ICD-10-CM | POA: Insufficient documentation

## 2019-03-15 MED ORDER — TEMAZEPAM 15 MG PO CAPS: 15.0000 mg | ORAL_CAPSULE | Freq: Every evening | ORAL | 1 refills | Status: DC | PRN

## 2019-03-15 NOTE — Progress Notes (Signed)
Date:  03/15/2019   Name:  Marie Whitney   DOB:  1966-10-30   MRN:  [id number]  I connected with this patient, Marie Whitney, by telephone at the patient's home . I verified that I am speaking with the correct person using two identifiers. This visit was conducted via telephone due to the Covid-19 outbreak from my office at [facility] in [place], [place].  I discussed the limitations, risks, security and privacy concerns of performing an evaluation and management service by telephone. I also discussed with the patient that there may be a patient responsible charge related to this service. The patient expressed understanding and agreed to proceed.  Chief Complaint: Insomnia (Increased anxiety since COVID19 and family in [place] suffering. Also left husband. Having to double up on sleeping med due to all this.Will do weight visit when able to get ride. )  Insomnia  Primary symptoms: sleep disturbance.  The current episode started more than one year. The problem occurs nightly. The problem is unchanged. The symptoms are aggravated by anxiety and family issues.  Anxiety  Presents for follow-up visit. Symptoms include excessive worry, insomnia and nervous/anxious behavior. Patient reports no chest pain, decreased concentration, dizziness, palpitations, shortness of breath or suicidal ideas. Symptoms occur most days. The severity of symptoms is interfering with daily activities (some benefit from bupropion). The quality of sleep is poor.    Gastroesophageal Reflux  She complains of heartburn. She reports no chest pain, no sore throat or no wheezing. This is a recurrent problem. The problem occurs occasionally. She has tried a histamine-2 antagonist for the symptoms. The treatment provided moderate (She has been taking tagamet with ranitidine for breakthrough sx.  Advised to stop ranitidine) relief.    Review of Systems  Constitutional: Negative for chills and fever.  HENT: Positive for  postnasal drip. Negative for sore throat.   Respiratory: Negative for chest tightness, shortness of breath and wheezing.   Cardiovascular: Negative for chest pain, palpitations and leg swelling.  Gastrointestinal: Positive for heartburn.  Allergic/Immunologic: Positive for environmental allergies.  Neurological: Negative for dizziness and headaches.  Psychiatric/Behavioral: Positive for sleep disturbance. Negative for decreased concentration, dysphoric mood and suicidal ideas. The patient is nervous/anxious and has insomnia.     Patient Active Problem List   Diagnosis Date Noted   Anxiety disorder 01/12/2016   Muscle spasm of back 11/12/2015   Insomnia 11/12/2015   Tinnitus of right ear 11/12/2015   Mixed hyperlipidemia 09/28/2015   Constipation due to slow transit 09/28/2015   Herpes simplex disease 09/28/2015   Allergic rhinitis 09/28/2015   Hx of abnormal mammogram 09/28/2015    No Known Allergies  Past Surgical History:  Procedure Laterality Date   none      Social History   Tobacco Use   Smoking status: Never Smoker   Smokeless tobacco: Never Used  Substance Use Topics   Alcohol use: No    Alcohol/week: 2.0 standard drinks    Types: 2 Standard drinks or equivalent per week   Drug use: Not on file     Medication list has been reviewed and updated.  Current Meds  Medication Sig   albuterol (PROVENTIL HFA;VENTOLIN HFA) 108 (90 Base) MCG/ACT inhaler Inhale 1-2 puffs into the lungs every 6 (six) hours as needed for wheezing or shortness of breath.   buPROPion (WELLBUTRIN XL) 150 MG 24 hr tablet TAKE 1 TABLET BY MOUTH EVERY DAY   cimetidine (TAGAMET) 400 MG tablet TAKE 1 TABLET BY MOUTH  TWICE A DAY   cyclobenzaprine (FLEXERIL) 10 MG tablet TAKE 1 TABLET BY MOUTH THREE TIMES A DAY AS NEEDED FOR MUSCLE SPASMS   fexofenadine (ALLEGRA ALLERGY) 180 MG tablet Take 1 tablet (180 mg total) by mouth daily.   hyoscyamine (LEVSIN SL) 0.125 MG SL tablet Place  1 tab on the tongue to dissolve, every 6 hours as needed for spasms.   phentermine 37.5 MG capsule Take 1 capsule (37.5 mg total) by mouth every morning.   polyethylene glycol powder (GLYCOLAX/MIRALAX) powder TAKE 17 G BY MOUTH DAILY.   temazepam (RESTORIL) 15 MG capsule Take 1 capsule (15 mg total) by mouth at bedtime as needed for sleep.   valACYclovir (VALTREX) 1000 MG tablet Take 1 tablet (1,000 mg total) by mouth daily.   [DISCONTINUED] ranitidine (ZANTAC) 150 MG tablet TAKE 1 TABLET BY MOUTH TWICE A DAY   [DISCONTINUED] temazepam (RESTORIL) 7.5 MG capsule Take 1 capsule (7.5 mg total) by mouth at bedtime as needed for sleep.    PHQ 2/9 Scores 03/15/2019 11/21/2018 11/21/2018 09/13/2017  PHQ - 2 Score 1 6 6 2   PHQ- 9 Score 5 24 24 18     BP Readings from Last 3 Encounters:  03/15/19 120/80  11/21/18 112/78  09/13/17 118/72    Physical Exam Neurological:     Mental Status: She is alert.  Psychiatric:        Attention and Perception: Attention normal.        Mood and Affect: Mood normal.        Speech: Speech normal.        Cognition and Memory: Cognition normal.     Wt Readings from Last 3 Encounters:  03/15/19 220 lb (99.8 kg)  11/21/18 228 lb (103.4 kg)  09/13/17 223 lb (101.2 kg)    BP 120/80    Resp 16    Ht 5\' 4"  (1.626 m)    Wt 220 lb (99.8 kg)    BMI 37.76 kg/m   Assessment and Plan: 1. Primary insomnia Worsening with current family issues Will increase to 30 mg  - temazepam (RESTORIL) 15 MG capsule; Take 1 capsule (15 mg total) by mouth at bedtime as needed for sleep.  Dispense: 90 capsule; Refill: 1  2. Generalized anxiety disorder Fairly good control - continue bupropion  3. GERD without esophagitis Stop zantac Continue tagamet  I spent 14 minutes on this encounter.  Partially dictated using Animal nutritionist. Any errors are unintentional.  Bari Edward, MD [facility]  03/15/2019

## 2019-03-16 ENCOUNTER — Ambulatory Visit: Payer: Self-pay | Admitting: Internal Medicine

## 2019-06-16 ENCOUNTER — Other Ambulatory Visit: Payer: Self-pay | Admitting: Internal Medicine

## 2019-06-16 DIAGNOSIS — M62830 Muscle spasm of back: Secondary | ICD-10-CM

## 2019-07-31 ENCOUNTER — Other Ambulatory Visit: Payer: Self-pay | Admitting: Internal Medicine

## 2019-07-31 DIAGNOSIS — F411 Generalized anxiety disorder: Secondary | ICD-10-CM

## 2019-08-28 ENCOUNTER — Other Ambulatory Visit: Payer: Self-pay | Admitting: Internal Medicine

## 2019-08-28 DIAGNOSIS — F411 Generalized anxiety disorder: Secondary | ICD-10-CM

## 2019-08-29 ENCOUNTER — Other Ambulatory Visit: Payer: Self-pay | Admitting: Internal Medicine

## 2019-08-29 DIAGNOSIS — M62830 Muscle spasm of back: Secondary | ICD-10-CM

## 2019-08-29 NOTE — Telephone Encounter (Signed)
Please call this patient. She needs an appt in the next 30 days to continue refills. Thank you.

## 2019-08-30 ENCOUNTER — Other Ambulatory Visit: Payer: Self-pay | Admitting: Internal Medicine

## 2019-08-30 DIAGNOSIS — F5101 Primary insomnia: Secondary | ICD-10-CM

## 2019-11-13 ENCOUNTER — Ambulatory Visit
Admission: RE | Admit: 2019-11-13 | Discharge: 2019-11-13 | Disposition: A | Discharge status: Home / Self Care | Payer: BC Managed Care – PPO | Source: Ambulatory Visit | Attending: Internal Medicine | Admitting: Internal Medicine

## 2019-11-13 ENCOUNTER — Other Ambulatory Visit: Payer: Self-pay

## 2019-11-13 ENCOUNTER — Encounter: Payer: Self-pay | Admitting: Internal Medicine

## 2019-11-13 ENCOUNTER — Ambulatory Visit (INDEPENDENT_AMBULATORY_CARE_PROVIDER_SITE_OTHER): Payer: BC Managed Care – PPO | Admitting: Internal Medicine

## 2019-11-13 ENCOUNTER — Other Ambulatory Visit (HOSPITAL_COMMUNITY)
Admission: RE | Admit: 2019-11-13 | Discharge: 2019-11-13 | Disposition: A | Discharge status: Home / Self Care | Payer: BC Managed Care – PPO | Source: Ambulatory Visit | Attending: Internal Medicine | Admitting: Internal Medicine

## 2019-11-13 VITALS — BP 122/70 | HR 89 | Ht 64.0 in | Wt 218.0 lb

## 2019-11-13 DIAGNOSIS — F411 Generalized anxiety disorder: Secondary | ICD-10-CM | POA: Diagnosis not present

## 2019-11-13 DIAGNOSIS — Z1211 Encounter for screening for malignant neoplasm of colon: Secondary | ICD-10-CM

## 2019-11-13 DIAGNOSIS — E782 Mixed hyperlipidemia: Secondary | ICD-10-CM

## 2019-11-13 DIAGNOSIS — M62830 Muscle spasm of back: Secondary | ICD-10-CM | POA: Diagnosis not present

## 2019-11-13 DIAGNOSIS — Z6837 Body mass index (BMI) 37.0-37.9, adult: Secondary | ICD-10-CM

## 2019-11-13 DIAGNOSIS — K219 Gastro-esophageal reflux disease without esophagitis: Secondary | ICD-10-CM

## 2019-11-13 DIAGNOSIS — F5101 Primary insomnia: Secondary | ICD-10-CM | POA: Diagnosis not present

## 2019-11-13 DIAGNOSIS — Z124 Encounter for screening for malignant neoplasm of cervix: Secondary | ICD-10-CM | POA: Diagnosis present

## 2019-11-13 DIAGNOSIS — Z1231 Encounter for screening mammogram for malignant neoplasm of breast: Secondary | ICD-10-CM | POA: Insufficient documentation

## 2019-11-13 DIAGNOSIS — B009 Herpesviral infection, unspecified: Secondary | ICD-10-CM | POA: Diagnosis not present

## 2019-11-13 DIAGNOSIS — Z0000 Encounter for general adult medical examination without abnormal findings: Secondary | ICD-10-CM | POA: Diagnosis not present

## 2019-11-13 LAB — POCT URINALYSIS DIPSTICK
Bilirubin, UA: NEGATIVE
Glucose, UA: NEGATIVE
Ketones, UA: NEGATIVE
Leukocytes, UA: NEGATIVE
Nitrite, UA: NEGATIVE
Protein, UA: NEGATIVE
Spec Grav, UA: 1.01 (ref 1.010–1.025)
Urobilinogen, UA: 0.2 E.U./dL
pH, UA: 6 (ref 5.0–8.0)

## 2019-11-13 MED ORDER — PHENTERMINE HCL 37.5 MG PO CAPS: 37.5000 mg | ORAL_CAPSULE | ORAL | 1 refills | Status: DC

## 2019-11-13 MED ORDER — BUPROPION HCL ER (XL) 150 MG PO TB24: 150.0000 mg | ORAL_TABLET | Freq: Every day | ORAL | 3 refills | Status: DC

## 2019-11-13 MED ORDER — CYCLOBENZAPRINE HCL 10 MG PO TABS: ORAL_TABLET | ORAL | 3 refills | Status: DC

## 2019-11-13 MED ORDER — VALACYCLOVIR HCL 1 G PO TABS: 1000.0000 mg | ORAL_TABLET | Freq: Every day | ORAL | 3 refills | Status: DC

## 2019-11-13 NOTE — Progress Notes (Signed)
Date:  11/13/2019   Name:  Marie Whitney   DOB:  1966-03-22   MRN:  [id number]   Chief Complaint: Annual Exam (Breast exam and mammo needed. Wants pap today.) Marie Whitney is a 53 y.o. female who presents today for her Complete Annual Exam. She feels well. She reports exercising some and has lost about 10 lbs. She reports she is sleeping fairly well. She denies breast issues.  She is filing for divorce and hopes that her husband will agree.  She is not concerned about STIs but does want HPV testing.  Mammogram  2016 Pap  10/2015 - neg with cotesting Colonoscopy - none Immunization History  Administered Date(s) Administered  . Influenza Inj Mdck Quad Pf 12/15/2018  . Pneumococcal Polysaccharide-23 11/21/2018    Anxiety Presents for follow-up visit. Symptoms include insomnia and nervous/anxious behavior. Patient reports no chest pain, dizziness, palpitations or shortness of breath. Symptoms occur most days.   Compliance with medications is 76-100% (bupropion).  Gastroesophageal Reflux She complains of heartburn. She reports no abdominal pain, no chest pain, no coughing or no wheezing. This is a recurrent problem. The problem occurs occasionally. Pertinent negatives include no fatigue. She has tried a histamine-2 antagonist for the symptoms. The treatment provided significant relief.  Insomnia Primary symptoms: no sleep disturbance, difficulty falling asleep, frequent awakening.  The symptoms are aggravated by anxiety and family issues. How many beverages per day that contain caffeine: 0 - 1.  Past treatments include medication (temazepam). The treatment provided significant relief.  Weight - she was losing weight on phentermine but ran out of refills and could not get back her for weight check and follow up.  Now that her husband has moved, she has a neighbor that may be able to bring her more regularly. Muscle spasm of neck and back - she is using flexeril nightly to help with pain  and spasm.  Sx are generally stable - no radicular symptoms.  Lab Results  Component Value Date   CREATININE 0.74 11/21/2018   BUN 16 11/21/2018   NA 140 11/21/2018   K 3.8 11/21/2018   CL 98 11/21/2018   CO2 25 11/21/2018   Lab Results  Component Value Date   CHOL 209 (H) 09/13/2017   HDL 42 09/13/2017   LDLCALC 133 (H) 09/13/2017   TRIG 168 (H) 09/13/2017   CHOLHDL 5.0 (H) 09/13/2017   Lab Results  Component Value Date   TSH 1.640 11/21/2018   No results found for: HGBA1C   Review of Systems  Constitutional: Negative for chills, fatigue, fever and unexpected weight change (lost over 10 lbs on phentermine).  HENT: Negative for congestion, hearing loss, tinnitus, trouble swallowing and voice change.   Eyes: Negative for visual disturbance.  Respiratory: Negative for cough, chest tightness, shortness of breath and wheezing.   Cardiovascular: Negative for chest pain, palpitations and leg swelling.  Gastrointestinal: Positive for heartburn. Negative for abdominal pain, constipation, diarrhea and vomiting.  Endocrine: Negative for polydipsia and polyuria.  Genitourinary: Negative for dysuria, frequency, genital sores, vaginal bleeding and vaginal discharge.  Musculoskeletal: Positive for myalgias. Negative for arthralgias, gait problem and joint swelling.  Skin: Negative for color change and rash.  Allergic/Immunologic: Negative for environmental allergies.  Neurological: Negative for dizziness, tremors, light-headedness and headaches.  Hematological: Negative for adenopathy. Does not bruise/bleed easily.  Psychiatric/Behavioral: Negative for dysphoric mood and sleep disturbance. The patient is nervous/anxious and has insomnia.     Patient Active Problem List  Diagnosis Date Noted  . GERD without esophagitis 03/15/2019  . Anxiety disorder 01/12/2016  . Muscle spasm of back 11/12/2015  . Insomnia 11/12/2015  . Tinnitus of right ear 11/12/2015  . Mixed hyperlipidemia  09/28/2015  . Constipation due to slow transit 09/28/2015  . Herpes simplex disease 09/28/2015  . Allergic rhinitis 09/28/2015  . Hx of abnormal mammogram 09/28/2015    No Known Allergies  Past Surgical History:  Procedure Laterality Date  . none      Social History   Tobacco Use  . Smoking status: Never Smoker  . Smokeless tobacco: Never Used  Substance Use Topics  . Alcohol use: No    Alcohol/week: 2.0 standard drinks    Types: 2 Standard drinks or equivalent per week  . Drug use: Not on file     Medication list has been reviewed and updated.  Current Meds  Medication Sig  . albuterol (PROVENTIL HFA;VENTOLIN HFA) 108 (90 Base) MCG/ACT inhaler Inhale 1-2 puffs into the lungs every 6 (six) hours as needed for wheezing or shortness of breath.  Marland Kitchen buPROPion (WELLBUTRIN XL) 150 MG 24 hr tablet TAKE 1 TABLET BY MOUTH EVERY DAY  . cimetidine (TAGAMET) 400 MG tablet TAKE 1 TABLET BY MOUTH TWICE A DAY  . cyclobenzaprine (FLEXERIL) 10 MG tablet TAKE 1 TABLET BY MOUTH THREE TIMES A DAY AS NEEDED FOR MUSCLE SPASMS  . fexofenadine (ALLEGRA ALLERGY) 180 MG tablet Take 1 tablet (180 mg total) by mouth daily.  . hyoscyamine (LEVSIN SL) 0.125 MG SL tablet Place 1 tab on the tongue to dissolve, every 6 hours as needed for spasms.  . valACYclovir (VALTREX) 1000 MG tablet Take 1 tablet (1,000 mg total) by mouth daily.    PHQ 2/9 Scores 11/13/2019 03/15/2019 11/21/2018 11/21/2018  PHQ - 2 Score PHQ- 9 Score GAD 7 : Generalized Anxiety Score 03/15/2019  Nervous, Anxious, on Edge 3  Control/stop worrying 3  Worry too much - different things 3  Trouble relaxing 3  Restless 0  Easily annoyed or irritable 0  Afraid - awful might happen 3  Total GAD 7 Score 15     BP Readings from Last 3 Encounters:  11/13/19 122/70  03/15/19 120/80  11/21/18 112/78    Physical Exam Vitals signs and nursing note reviewed.  Constitutional:      General: She is not in acute  distress.    Appearance: She is well-developed.  HENT:     Head: Normocephalic and atraumatic.     Right Ear: Tympanic membrane and ear canal normal.     Left Ear: Tympanic membrane and ear canal normal.     Nose:     Right Sinus: No maxillary sinus tenderness.     Left Sinus: No maxillary sinus tenderness.  Eyes:     General: No scleral icterus.       Right eye: No discharge.        Left eye: No discharge.     Conjunctiva/sclera: Conjunctivae normal.  Neck:     Musculoskeletal: Normal range of motion. No erythema.     Thyroid: No thyromegaly.     Vascular: No carotid bruit.  Cardiovascular:     Rate and Rhythm: Normal rate and regular rhythm.     Pulses: Normal pulses.     Heart sounds: Normal heart sounds.  Pulmonary:     Effort: Pulmonary effort is normal. No respiratory distress.     Breath  sounds: No wheezing.  Chest:     Breasts:        Right: No mass, nipple discharge, skin change or tenderness.        Left: No mass, nipple discharge, skin change or tenderness.  Abdominal:     General: Bowel sounds are normal.     Palpations: Abdomen is soft.     Tenderness: There is no abdominal tenderness.  Genitourinary:    Labia:        Right: No rash, tenderness or lesion.        Left: No rash, tenderness or lesion.      Vagina: Normal.     Cervix: Normal.     Uterus: Normal.      Adnexa: Right adnexa normal and left adnexa normal.     Comments: Pap obtained Musculoskeletal: Normal range of motion.     Right lower leg: No edema.     Left lower leg: No edema.  Lymphadenopathy:     Cervical: No cervical adenopathy.  Skin:    General: Skin is warm and dry.     Capillary Refill: Capillary refill takes less than 2 seconds.     Findings: No rash.       Neurological:     General: No focal deficit present.     Mental Status: She is alert and oriented to person, place, and time.     Cranial Nerves: No cranial nerve deficit.     Sensory: No sensory deficit.     Deep Tendon  Reflexes: Reflexes are normal and symmetric.  Psychiatric:        Attention and Perception: Attention normal.        Mood and Affect: Mood normal.        Speech: Speech normal.        Behavior: Behavior normal.        Thought Content: Thought content normal.     Wt Readings from Last 3 Encounters:  11/13/19 218 lb (98.9 kg)  03/15/19 220 lb (99.8 kg)  11/21/18 228 lb (103.4 kg)    BP 122/70   Pulse 89   Ht 5\' 4"  (1.626 m)   Wt 218 lb (98.9 kg)   SpO2 98%   BMI 37.42 kg/m   Assessment and Plan: 1. Annual physical exam Normal exam except for weight Continue to work on exercise - POCT urinalysis dipstick  2. Encounter for screening mammogram for breast cancer To be scheduled at patient's convenience - MM 3D SCREEN BREAST BILATERAL; Future  3. Generalized anxiety disorder Clinically stable on medication Improved since husband has moved out and hoping for a divorce - buPROPion (WELLBUTRIN XL) 150 MG 24 hr tablet; Take 1 tablet (150 mg total) by mouth daily.  Dispense: 90 tablet; Refill: 3 - TSH  4. Muscle spasm of back Mild, chronic symptoms - cyclobenzaprine (FLEXERIL) 10 MG tablet; TAKE 1 TABLET BY MOUTH THREE TIMES A DAY AS NEEDED FOR MUSCLE SPASMS  Dispense: 270 tablet; Refill: 3  5. Herpes simplex disease Continue daily prophylaxis - valACYclovir (VALTREX) 1000 MG tablet; Take 1 tablet (1,000 mg total) by mouth daily.  Dispense: 90 tablet; Refill: 3  6. Primary insomnia On Temazepam nightly  7. Encounter for screening for cervical cancer  Pelvic exam normal Pap sent - [facility] - PAP  8. Mixed hyperlipidemia Check labs and advise if medication is needed - Comprehensive metabolic panel - Lipid panel  9. GERD without esophagitis Symptoms well controlled on daily PPI No red  flag signs such as weight loss, n/v, melena Will continue daily H2 blocker. - CBC with Differential/Platelet  10. Colon cancer screening Unsure if she can get transportation for  colonoscopy Will do FIT now  - Fecal occult blood, imunochemical  11. BMI 37.0-37.9, adult She claims significant weight loss on phentermine but was unable to continue therapy due to transportation/follow up issues (she did lose 10 lbs since last year) She believes she can return for follow up in 2 months - phentermine 37.5 MG capsule; Take 1 capsule (37.5 mg total) by mouth every morning.  Dispense: 30 capsule; Refill: 1   Partially dictated using Editor, commissioning. Any errors are unintentional.  Halina Maidens, MD [facility]  11/13/2019

## 2019-11-14 ENCOUNTER — Other Ambulatory Visit: Payer: Self-pay | Admitting: Internal Medicine

## 2019-11-14 ENCOUNTER — Other Ambulatory Visit: Payer: Self-pay

## 2019-11-14 ENCOUNTER — Inpatient Hospital Stay
Admission: RE | Admit: 2019-11-14 | Discharge: 2019-11-14 | Disposition: A | Discharge status: Home / Self Care | Payer: Self-pay | Source: Ambulatory Visit | Attending: Internal Medicine | Admitting: Internal Medicine

## 2019-11-14 DIAGNOSIS — Z1231 Encounter for screening mammogram for malignant neoplasm of breast: Secondary | ICD-10-CM

## 2019-11-14 DIAGNOSIS — K219 Gastro-esophageal reflux disease without esophagitis: Secondary | ICD-10-CM

## 2019-11-14 LAB — COMPREHENSIVE METABOLIC PANEL
ALT: 37 IU/L — ABNORMAL HIGH (ref 0–32)
AST: 33 IU/L (ref 0–40)
Albumin/Globulin Ratio: 1.3 (ref 1.2–2.2)
Albumin: 5 g/dL — ABNORMAL HIGH (ref 3.8–4.9)
Alkaline Phosphatase: 107 IU/L (ref 39–117)
BUN/Creatinine Ratio: 22 (ref 9–23)
BUN: 17 mg/dL (ref 6–24)
Bilirubin Total: 0.3 mg/dL (ref 0.0–1.2)
CO2: 24 mmol/L (ref 20–29)
Calcium: 9.9 mg/dL (ref 8.7–10.2)
Chloride: 98 mmol/L (ref 96–106)
Creatinine, Ser: 0.78 mg/dL (ref 0.57–1.00)
GFR calc Af Amer: 100 mL/min/{1.73_m2} (ref >59)
GFR calc non Af Amer: 87 mL/min/{1.73_m2} (ref >59)
Globulin, Total: 3.8 g/dL (ref 1.5–4.5)
Glucose: 106 mg/dL — ABNORMAL HIGH (ref 65–99)
Potassium: 4.9 mmol/L (ref 3.5–5.2)
Sodium: 138 mmol/L (ref 134–144)
Total Protein: 8.8 g/dL — ABNORMAL HIGH (ref 6.0–8.5)

## 2019-11-14 LAB — CBC WITH DIFFERENTIAL/PLATELET
Basophils Absolute: 0 10*3/uL (ref 0.0–0.2)
Basos: 0 %
EOS (ABSOLUTE): 0 10*3/uL (ref 0.0–0.4)
Eos: 0 %
Hematocrit: 43.7 % (ref 34.0–46.6)
Hemoglobin: 14.9 g/dL (ref 11.1–15.9)
Immature Grans (Abs): 0 10*3/uL (ref 0.0–0.1)
Immature Granulocytes: 0 %
Lymphocytes Absolute: 1.5 10*3/uL (ref 0.7–3.1)
Lymphs: 29 %
MCH: 28.6 pg (ref 26.6–33.0)
MCHC: 34.1 g/dL (ref 31.5–35.7)
MCV: 84 fL (ref 79–97)
Monocytes Absolute: 0.4 10*3/uL (ref 0.1–0.9)
Monocytes: 7 %
Neutrophils Absolute: 3.2 10*3/uL (ref 1.4–7.0)
Neutrophils: 64 %
Platelets: 404 10*3/uL (ref 150–450)
RBC: 5.21 x10E6/uL (ref 3.77–5.28)
RDW: 12.5 % (ref 11.7–15.4)
WBC: 5.1 10*3/uL (ref 3.4–10.8)

## 2019-11-14 LAB — LIPID PANEL
Chol/HDL Ratio: 5.2 ratio — ABNORMAL HIGH (ref 0.0–4.4)
Cholesterol, Total: 248 mg/dL — ABNORMAL HIGH (ref 100–199)
HDL: 48 mg/dL (ref >39)
LDL Chol Calc (NIH): 181 mg/dL — ABNORMAL HIGH (ref 0–99)
Triglycerides: 108 mg/dL (ref 0–149)
VLDL Cholesterol Cal: 19 mg/dL (ref 5–40)

## 2019-11-14 LAB — TSH: TSH: 1.3 u[IU]/mL (ref 0.450–4.500)

## 2019-11-14 MED ORDER — CIMETIDINE 400 MG PO TABS: 400.0000 mg | ORAL_TABLET | Freq: Two times a day (BID) | ORAL | 2 refills | Status: DC

## 2019-11-16 LAB — CYTOLOGY - PAP
Comment: NEGATIVE
Diagnosis: NEGATIVE
High risk HPV: NEGATIVE

## 2019-11-19 ENCOUNTER — Inpatient Hospital Stay
Admission: RE | Admit: 2019-11-19 | Discharge: 2019-11-19 | Disposition: A | Discharge status: Home / Self Care | Payer: Self-pay | Source: Ambulatory Visit | Attending: *Deleted | Admitting: *Deleted

## 2019-11-19 ENCOUNTER — Other Ambulatory Visit: Payer: Self-pay | Admitting: *Deleted

## 2019-11-19 DIAGNOSIS — Z1231 Encounter for screening mammogram for malignant neoplasm of breast: Secondary | ICD-10-CM

## 2019-12-05 ENCOUNTER — Other Ambulatory Visit: Payer: Self-pay | Admitting: Internal Medicine

## 2019-12-05 DIAGNOSIS — F5101 Primary insomnia: Secondary | ICD-10-CM

## 2019-12-05 MED ORDER — TEMAZEPAM 15 MG PO CAPS: 15.0000 mg | ORAL_CAPSULE | Freq: Every evening | ORAL | 0 refills | Status: DC | PRN

## 2019-12-25 ENCOUNTER — Telehealth: Payer: Self-pay

## 2019-12-25 NOTE — Telephone Encounter (Signed)
Patient called concerned about a bill she received with a CPE. She said she doesn't understand why she received a bill with her CPE. Explained when she discusses anything outside of a CPE she will be charged. This includes medication refills or follow ups for any problems.   She verbalized understanding. Confirmed her next appt date and time and explained this should be one charge for this next visit because it is not a CPE and just a follow up on weight and medication.

## 2020-01-15 ENCOUNTER — Encounter: Payer: Self-pay | Admitting: Internal Medicine

## 2020-01-15 ENCOUNTER — Ambulatory Visit: Payer: BC Managed Care – PPO | Admitting: Internal Medicine

## 2020-01-15 ENCOUNTER — Other Ambulatory Visit: Payer: Self-pay

## 2020-01-15 DIAGNOSIS — Z6837 Body mass index (BMI) 37.0-37.9, adult: Secondary | ICD-10-CM | POA: Diagnosis not present

## 2020-01-15 DIAGNOSIS — F5101 Primary insomnia: Secondary | ICD-10-CM | POA: Diagnosis not present

## 2020-01-15 MED ORDER — TEMAZEPAM 15 MG PO CAPS: 15.0000 mg | ORAL_CAPSULE | Freq: Every evening | ORAL | 0 refills | Status: DC | PRN

## 2020-01-15 MED ORDER — PHENTERMINE HCL 37.5 MG PO CAPS: 37.5000 mg | ORAL_CAPSULE | ORAL | 0 refills | Status: DC

## 2020-01-15 NOTE — Progress Notes (Signed)
Date:  01/15/2020   Name:  Marie Whitney   DOB:  03/12/1966   MRN:  [id number]   Chief Complaint: Weight Loss (Rf for Phentermine. )  Weight loss - pt was prescribed phentermine 2 months ago.  Unfortunately she has gained 2 lbs. She claims that the Christmas holiday provided too many sweets and she was not using her willpower.  She has also not been exercising.  I advised that she did not lose any weight and therefore she should not continue it.  She became tearful saying that she knows she can lose weight on it because she did in the past.   Insomnia Primary symptoms: sleep disturbance.  The problem occurs nightly. Past treatments include medication (temazepam was very beneficial.). The treatment provided significant relief.    Lab Results  Component Value Date   CREATININE 0.78 11/13/2019   BUN 17 11/13/2019   NA 138 11/13/2019   K 4.9 11/13/2019   CL 98 11/13/2019   CO2 24 11/13/2019   Lab Results  Component Value Date   CHOL 248 (H) 11/13/2019   HDL 48 11/13/2019   LDLCALC 181 (H) 11/13/2019   TRIG 108 11/13/2019   CHOLHDL 5.2 (H) 11/13/2019   Lab Results  Component Value Date   TSH 1.300 11/13/2019   No results found for: HGBA1C   Review of Systems  Constitutional: Positive for unexpected weight change (2 lbs). Negative for chills, fatigue and fever.  Respiratory: Negative for cough, shortness of breath and wheezing.   Cardiovascular: Negative for chest pain and palpitations.  Gastrointestinal: Negative for abdominal pain.  Genitourinary: Negative for dysuria.  Neurological: Negative for dizziness, light-headedness and headaches.  Psychiatric/Behavioral: Positive for sleep disturbance. The patient has insomnia.     Patient Active Problem List   Diagnosis Date Noted  . GERD without esophagitis 03/15/2019  . Anxiety disorder 01/12/2016  . Muscle spasm of back 11/12/2015  . Insomnia 11/12/2015  . Tinnitus of right ear 11/12/2015  . Mixed hyperlipidemia  09/28/2015  . Constipation due to slow transit 09/28/2015  . Herpes simplex disease 09/28/2015  . Allergic rhinitis 09/28/2015  . Hx of abnormal mammogram 09/28/2015    No Known Allergies  Past Surgical History:  Procedure Laterality Date  . none      Social History   Tobacco Use  . Smoking status: Never Smoker  . Smokeless tobacco: Never Used  Substance Use Topics  . Alcohol use: No    Alcohol/week: 2.0 standard drinks    Types: 2 Standard drinks or equivalent per week  . Drug use: Not on file     Medication list has been reviewed and updated.  Current Meds  Medication Sig  . albuterol (PROVENTIL HFA;VENTOLIN HFA) 108 (90 Base) MCG/ACT inhaler Inhale 1-2 puffs into the lungs every 6 (six) hours as needed for wheezing or shortness of breath.  Marland Kitchen buPROPion (WELLBUTRIN XL) 150 MG 24 hr tablet Take 1 tablet (150 mg total) by mouth daily.  . cimetidine (TAGAMET) 400 MG tablet Take 1 tablet (400 mg total) by mouth 2 (two) times daily.  . cyclobenzaprine (FLEXERIL) 10 MG tablet TAKE 1 TABLET BY MOUTH THREE TIMES A DAY AS NEEDED FOR MUSCLE SPASMS  . fexofenadine (ALLEGRA ALLERGY) 180 MG tablet Take 1 tablet (180 mg total) by mouth daily.  . hyoscyamine (LEVSIN SL) 0.125 MG SL tablet Place 1 tab on the tongue to dissolve, every 6 hours as needed for spasms.  . phentermine 37.5 MG capsule Take  1 capsule (37.5 mg total) by mouth every morning.  . temazepam (RESTORIL) 15 MG capsule Take 1 capsule (15 mg total) by mouth at bedtime as needed for sleep.  . valACYclovir (VALTREX) 1000 MG tablet Take 1 tablet (1,000 mg total) by mouth daily.    PHQ 2/9 Scores 01/15/2020 11/13/2019 03/15/2019 11/21/2018  PHQ - 2 Score 0 4 1 6   PHQ- 9 Score 2 9 5 24     BP Readings from Last 3 Encounters:  01/15/20 112/78  11/13/19 122/70  03/15/19 120/80    Physical Exam Vitals and nursing note reviewed.  Constitutional:      General: She is not in acute distress.    Appearance: She is  well-developed.  HENT:     Head: Normocephalic and atraumatic.  Cardiovascular:     Rate and Rhythm: Regular rhythm. Tachycardia present.     Pulses: Normal pulses.     Heart sounds: No murmur.  Pulmonary:     Effort: Pulmonary effort is normal. No respiratory distress.     Breath sounds: No wheezing or rhonchi.  Musculoskeletal:     Cervical back: Normal range of motion.     Right lower leg: No edema.     Left lower leg: No edema.  Skin:    General: Skin is warm and dry.     Findings: No rash.  Neurological:     Mental Status: She is alert and oriented to person, place, and time.  Psychiatric:        Behavior: Behavior normal.        Thought Content: Thought content normal.     Wt Readings from Last 3 Encounters:  01/15/20 220 lb (99.8 kg)  11/13/19 218 lb (98.9 kg)  03/15/19 220 lb (99.8 kg)    BP 112/78   Pulse (!) 106   Temp 98.7 F (37.1 C) (Oral)   Ht 5\' 4"  (1.626 m)   Wt 220 lb (99.8 kg)   LMP 11/12/2017   SpO2 98%   BMI 37.76 kg/m   Assessment and Plan: 1. BMI 37.0-37.9, adult I am hesitant to continue this medication but will give her one more month. She will work hard on diet and exercise.  If she does not lose at least 5 lbs, then will not be continuing it. - phentermine 37.5 MG capsule; Take 1 capsule (37.5 mg total) by mouth every morning.  Dispense: 30 capsule; Refill: 0  2. Primary insomnia Doing well on sleep aids without side effects. - temazepam (RESTORIL) 15 MG capsule; Take 1 capsule (15 mg total) by mouth at bedtime as needed for sleep.  Dispense: 30 capsule; Refill: 0   Partially dictated using Editor, commissioning. Any errors are unintentional.  Halina Maidens, MD [facility]  01/15/2020

## 2020-03-05 ENCOUNTER — Telehealth: Payer: Self-pay

## 2020-03-05 NOTE — Telephone Encounter (Signed)
Patient returning call from someone.  Unknown reason no msg and no hx with heartcare

## 2020-03-25 ENCOUNTER — Telehealth: Payer: Self-pay | Admitting: Internal Medicine

## 2020-03-25 NOTE — Telephone Encounter (Signed)
Called pt about exp. Stool kit. Left VM told her we could leave another one at the front to give [place] a call back.   KP

## 2020-04-02 NOTE — Telephone Encounter (Signed)
Patient would like to pick up stool kit tomorrow.  °

## 2020-04-03 ENCOUNTER — Other Ambulatory Visit: Payer: Self-pay | Admitting: Internal Medicine

## 2020-04-03 DIAGNOSIS — Z1211 Encounter for screening for malignant neoplasm of colon: Secondary | ICD-10-CM

## 2020-04-03 NOTE — Telephone Encounter (Signed)
Called pt and told her that I will leave a stool kit for her to pick up today 4/22 or tomorrow 4/23. Pt verbalized understanding. Had questions about a recent visit and insurance transferred her call to the proper person.   KP

## 2020-04-22 LAB — FECAL OCCULT BLOOD, IMMUNOCHEMICAL: Fecal Occult Bld: NEGATIVE

## 2020-04-22 LAB — SPECIMEN STATUS REPORT

## 2020-07-23 ENCOUNTER — Other Ambulatory Visit: Payer: Self-pay | Admitting: Internal Medicine

## 2020-07-23 DIAGNOSIS — F5101 Primary insomnia: Secondary | ICD-10-CM

## 2020-07-23 NOTE — Telephone Encounter (Signed)
Requested medication (s) are due for refill today: yes  Requested medication (s) are on the active medication list: yes  Last refill:  01/15/20 #30  Future visit scheduled: no  Notes to clinic:  Please refill for refill. Refill not delegated per protocol.    Requested Prescriptions  Pending Prescriptions Disp Refills   temazepam (RESTORIL) 15 MG capsule [Pharmacy Med Name: TEMAZEPAM 15 MG CAPSULE] 30 capsule     Sig: Take 1 capsule (15 mg total) by mouth at bedtime as needed for sleep.      Not Delegated - Psychiatry:  Anxiolytics/Hypnotics Failed - 07/23/2020 12:20 PM      Failed - This refill cannot be delegated      Failed - Urine Drug Screen completed in last 360 days.      Failed - Valid encounter within last 6 months    Recent Outpatient Visits           6 months ago BMI 37.0-37.9, adult   [facility] Reubin Milan, MD   8 months ago Encounter for screening mammogram for breast cancer   [facility] Reubin Milan, MD   1 year ago Generalized anxiety disorder   [facility] Reubin Milan, MD   1 year ago Mild intermittent asthma without complication   [facility] Reubin Milan, MD   2 years ago Annual physical exam   [facility] Reubin Milan, MD

## 2020-07-23 NOTE — Telephone Encounter (Signed)
Please Advise. Last office visit 01/15/20.  KP

## 2020-09-29 ENCOUNTER — Other Ambulatory Visit: Payer: Self-pay | Admitting: Internal Medicine

## 2020-09-29 DIAGNOSIS — K219 Gastro-esophageal reflux disease without esophagitis: Secondary | ICD-10-CM

## 2020-10-30 ENCOUNTER — Other Ambulatory Visit: Payer: Self-pay | Admitting: Internal Medicine

## 2020-10-30 DIAGNOSIS — M62830 Muscle spasm of back: Secondary | ICD-10-CM

## 2020-10-30 NOTE — Telephone Encounter (Signed)
Requested medication (s) are due for refill today- yes  Requested medication (s) are on the active medication list -yes  Future visit scheduled -no  Last refill: 08/03/20  Notes to clinic: Request non delegated Rx  Requested Prescriptions  Pending Prescriptions Disp Refills   cyclobenzaprine (FLEXERIL) 10 MG tablet [Pharmacy Med Name: CYCLOBENZAPRINE 10 MG TABLET] 270 tablet 3    Sig: TAKE 1 TABLET BY MOUTH THREE TIMES A DAY AS NEEDED FOR MUSCLE SPASMS      Not Delegated - Analgesics:  Muscle Relaxants Failed - 10/30/2020  9:05 AM      Failed - This refill cannot be delegated      Failed - Valid encounter within last 6 months    Recent Outpatient Visits           9 months ago BMI 37.0-37.9, adult   [facility] Reubin Milan, MD   11 months ago Encounter for screening mammogram for breast cancer   [facility] Reubin Milan, MD   1 year ago Generalized anxiety disorder   [facility] Reubin Milan, MD   1 year ago Mild intermittent asthma without complication   [facility] Reubin Milan, MD   3 years ago Annual physical exam   [facility] Reubin Milan, MD                  Requested Prescriptions  Pending Prescriptions Disp Refills   cyclobenzaprine (FLEXERIL) 10 MG tablet [Pharmacy Med Name: CYCLOBENZAPRINE 10 MG TABLET] 270 tablet 3    Sig: TAKE 1 TABLET BY MOUTH THREE TIMES A DAY AS NEEDED FOR MUSCLE SPASMS      Not Delegated - Analgesics:  Muscle Relaxants Failed - 10/30/2020  9:05 AM      Failed - This refill cannot be delegated      Failed - Valid encounter within last 6 months    Recent Outpatient Visits           9 months ago BMI 37.0-37.9, adult   [facility] Reubin Milan, MD   11 months ago Encounter for screening mammogram for breast cancer   [facility] Reubin Milan, MD   1 year ago Generalized anxiety disorder   [facility] Reubin Milan, MD   1 year ago Mild intermittent asthma without complication   [facility] Reubin Milan, MD   3 years ago Annual physical exam   [facility] Reubin Milan, MD

## 2020-11-03 ENCOUNTER — Other Ambulatory Visit: Payer: Self-pay | Admitting: Internal Medicine

## 2020-11-03 DIAGNOSIS — Z6837 Body mass index (BMI) 37.0-37.9, adult: Secondary | ICD-10-CM

## 2020-11-04 ENCOUNTER — Ambulatory Visit (INDEPENDENT_AMBULATORY_CARE_PROVIDER_SITE_OTHER): Payer: BC Managed Care – PPO | Admitting: Internal Medicine

## 2020-11-04 ENCOUNTER — Other Ambulatory Visit: Payer: Self-pay

## 2020-11-04 ENCOUNTER — Encounter: Payer: Self-pay | Admitting: Internal Medicine

## 2020-11-04 VITALS — BP 124/80 | HR 111 | Ht 64.0 in | Wt 231.0 lb

## 2020-11-04 DIAGNOSIS — L918 Other hypertrophic disorders of the skin: Secondary | ICD-10-CM

## 2020-11-04 DIAGNOSIS — F5101 Primary insomnia: Secondary | ICD-10-CM

## 2020-11-04 DIAGNOSIS — Z1231 Encounter for screening mammogram for malignant neoplasm of breast: Secondary | ICD-10-CM

## 2020-11-04 DIAGNOSIS — L709 Acne, unspecified: Secondary | ICD-10-CM | POA: Diagnosis not present

## 2020-11-04 DIAGNOSIS — F411 Generalized anxiety disorder: Secondary | ICD-10-CM

## 2020-11-04 DIAGNOSIS — Z6839 Body mass index (BMI) 39.0-39.9, adult: Secondary | ICD-10-CM

## 2020-11-04 MED ORDER — BUPROPION HCL ER (XL) 150 MG PO TB24: 150.0000 mg | ORAL_TABLET | Freq: Every day | ORAL | 1 refills | Status: DC

## 2020-11-04 MED ORDER — TEMAZEPAM 15 MG PO CAPS: 15.0000 mg | ORAL_CAPSULE | Freq: Every evening | ORAL | 5 refills | Status: DC | PRN

## 2020-11-04 NOTE — Progress Notes (Signed)
Date:  11/04/2020   Name:  Marie Whitney   DOB:  12-19-1965   MRN:  [id number]   Chief Complaint: Anxiety (Follow up.) and Insomnia (RF on temazepam.)  Anxiety Presents for follow-up visit. Symptoms include excessive worry, insomnia and nervous/anxious behavior. Patient reports no chest pain, dizziness, shortness of breath or suicidal ideas. Symptoms occur most days. The severity of symptoms is moderate. Nighttime awakenings: one to two.   Compliance with medications: bupropion.  Insomnia Primary symptoms: sleep disturbance, difficulty falling asleep, frequent awakening.  The onset quality is undetermined. The problem occurs every several days. The problem is unchanged. The symptoms are aggravated by anxiety. Treatments tried: Temazepam - last Rx in February.  Skin tag - has an irritated tag on her arm. Also acne lesions on her face that get inflamed. She has medication in the past to use but too expensive. With the ongoing stress over the divorce, her face is worse.  Lab Results  Component Value Date   CREATININE 0.78 11/13/2019   BUN 17 11/13/2019   NA 138 11/13/2019   K 4.9 11/13/2019   CL 98 11/13/2019   CO2 24 11/13/2019   Lab Results  Component Value Date   CHOL 248 (H) 11/13/2019   HDL 48 11/13/2019   LDLCALC 181 (H) 11/13/2019   TRIG 108 11/13/2019   CHOLHDL 5.2 (H) 11/13/2019   Lab Results  Component Value Date   TSH 1.300 11/13/2019   No results found for: HGBA1C Lab Results  Component Value Date   WBC 5.1 11/13/2019   HGB 14.9 11/13/2019   HCT 43.7 11/13/2019   MCV 84 11/13/2019   PLT 404 11/13/2019   Lab Results  Component Value Date   ALT 37 (H) 11/13/2019   AST 33 11/13/2019   ALKPHOS 107 11/13/2019   BILITOT 0.3 11/13/2019     Review of Systems  Constitutional: Positive for unexpected weight change. Negative for chills, fatigue and fever.  HENT: Negative for trouble swallowing.   Eyes: Negative for visual disturbance.  Respiratory:  Negative for cough, chest tightness and shortness of breath.   Cardiovascular: Negative for chest pain.  Gastrointestinal: Negative for abdominal pain, constipation and diarrhea.  Skin: Positive for color change and rash.  Neurological: Negative for dizziness and headaches.  Psychiatric/Behavioral: Positive for dysphoric mood and sleep disturbance. Negative for suicidal ideas. The patient is nervous/anxious and has insomnia.     Patient Active Problem List   Diagnosis Date Noted  . GERD without esophagitis 03/15/2019  . Anxiety disorder 01/12/2016  . Muscle spasm of back 11/12/2015  . Insomnia 11/12/2015  . Tinnitus of right ear 11/12/2015  . Mixed hyperlipidemia 09/28/2015  . Constipation due to slow transit 09/28/2015  . Herpes simplex disease 09/28/2015  . Allergic rhinitis 09/28/2015  . Hx of abnormal mammogram 09/28/2015    No Known Allergies  Past Surgical History:  Procedure Laterality Date  . none      Social History   Tobacco Use  . Smoking status: Never Smoker  . Smokeless tobacco: Never Used  Substance Use Topics  . Alcohol use: No    Alcohol/week: 2.0 standard drinks    Types: 2 Standard drinks or equivalent per week  . Drug use: Not on file     Medication list has been reviewed and updated.  Current Meds  Medication Sig  . albuterol (PROVENTIL HFA;VENTOLIN HFA) 108 (90 Base) MCG/ACT inhaler Inhale 1-2 puffs into the lungs every 6 (six) hours as needed  for wheezing or shortness of breath.  Marland Kitchen buPROPion (WELLBUTRIN XL) 150 MG 24 hr tablet Take 1 tablet (150 mg total) by mouth daily.  . cimetidine (TAGAMET) 400 MG tablet TAKE 1 TABLET BY MOUTH TWICE A DAY  . cyclobenzaprine (FLEXERIL) 10 MG tablet TAKE 1 TABLET BY MOUTH THREE TIMES A DAY AS NEEDED FOR MUSCLE SPASMS  . fexofenadine (ALLEGRA ALLERGY) 180 MG tablet Take 1 tablet (180 mg total) by mouth daily.  . hyoscyamine (LEVSIN SL) 0.125 MG SL tablet Place 1 tab on the tongue to dissolve, every 6 hours as  needed for spasms.  . temazepam (RESTORIL) 15 MG capsule Take 1 capsule (15 mg total) by mouth at bedtime as needed for sleep.  . valACYclovir (VALTREX) 1000 MG tablet Take 1 tablet (1,000 mg total) by mouth daily.    PHQ 2/9 Scores 11/04/2020 01/15/2020 11/13/2019 03/15/2019  PHQ - 2 Score 2 0 4 1  PHQ- 9 Score 6 2 9 5     GAD 7 : Generalized Anxiety Score 11/04/2020 01/15/2020 03/15/2019  Nervous, Anxious, on Edge 2 0 3  Control/stop worrying 2 1 3   Worry too much - different things 2 1 3   Trouble relaxing 0 0 3  Restless 0 0 0  Easily annoyed or irritable 0 0 0  Afraid - awful might happen 0 0 3  Total GAD 7 Score 6 2 15   Anxiety Difficulty Not difficult at all Not difficult at all -    BP Readings from Last 3 Encounters:  11/04/20 124/80  01/15/20 112/78  11/13/19 122/70    Physical Exam Vitals and nursing note reviewed.  Constitutional:      General: She is not in acute distress.    Appearance: Normal appearance. She is well-developed.  HENT:     Head: Normocephalic and atraumatic.  Neck:     Vascular: No carotid bruit.  Cardiovascular:     Rate and Rhythm: Normal rate and regular rhythm.     Pulses: Normal pulses.  Pulmonary:     Effort: Pulmonary effort is normal. No respiratory distress.     Breath sounds: No wheezing or rhonchi.  Musculoskeletal:        General: Normal range of motion.     Cervical back: Normal range of motion.     Right lower leg: No edema.     Left lower leg: No edema.  Lymphadenopathy:     Cervical: No cervical adenopathy.  Skin:    General: Skin is warm and dry.     Findings: Rash (lesion on chin and cheeks) present.          Comments: Elongated skin tag  Neurological:     Mental Status: She is alert and oriented to person, place, and time.  Psychiatric:        Attention and Perception: Attention normal.        Mood and Affect: Mood is depressed.        Speech: Speech normal.     Wt Readings from Last 3 Encounters:  11/04/20 231 lb  (104.8 kg)  01/15/20 220 lb (99.8 kg)  11/13/19 218 lb (98.9 kg)    BP 124/80   Pulse (!) 111   Ht 5\' 4"  (1.626 m)   Wt 231 lb (104.8 kg)   LMP 11/12/2017   SpO2 97%   BMI 39.65 kg/m   Assessment and Plan: 1. Generalized anxiety disorder Doing fairly well on bupropion Encourage regular exercise; meditation to reduce stress Hopefully her divorce  will go through soon - buPROPion (WELLBUTRIN XL) 150 MG 24 hr tablet; Take 1 tablet (150 mg total) by mouth daily.  Dispense: 90 tablet; Refill: 1  2. Primary insomnia Continue temazepam qhs prn - temazepam (RESTORIL) 15 MG capsule; Take 1 capsule (15 mg total) by mouth at bedtime as needed for sleep.  Dispense: 30 capsule; Refill: 5  3. Inflamed skin tag - Ambulatory referral to Dermatology  4. Acne, unspecified acne type - Ambulatory referral to Dermatology  5. Encounter for screening mammogram for breast cancer Due for annual exam - MM 3D SCREEN BREAST BILATERAL; Future  6. BMI 39.0-39.9,adult Weight continues to be a struggle for her.  She has not been able to come for appropriate follow up on phentermine.  Will not refill at this time.  I would consider in the future if she gets more reliable transportation and her social situation is secure. For now, continue dietary modifications and more exercise.   Partially dictated using Animal nutritionist. Any errors are unintentional.  Bari Edward, MD [facility]  11/04/2020

## 2020-11-07 ENCOUNTER — Other Ambulatory Visit: Payer: Self-pay | Admitting: Internal Medicine

## 2020-11-07 DIAGNOSIS — B009 Herpesviral infection, unspecified: Secondary | ICD-10-CM

## 2020-11-07 DIAGNOSIS — K219 Gastro-esophageal reflux disease without esophagitis: Secondary | ICD-10-CM

## 2020-11-07 DIAGNOSIS — M62830 Muscle spasm of back: Secondary | ICD-10-CM

## 2020-11-07 NOTE — Telephone Encounter (Signed)
Requested medications are due for refill today yes  Requested medications are on the active medication list yes  Last refill 8/22  Last visit 11/13/2019,  this med/dx last discussed in OV note   Future visit scheduled yes 11/13/2020  Notes to clinic Not Delegated.

## 2020-11-13 ENCOUNTER — Encounter: Payer: Self-pay | Admitting: Internal Medicine

## 2020-11-13 ENCOUNTER — Other Ambulatory Visit: Payer: Self-pay

## 2020-11-13 ENCOUNTER — Ambulatory Visit (INDEPENDENT_AMBULATORY_CARE_PROVIDER_SITE_OTHER): Payer: BC Managed Care – PPO | Admitting: Internal Medicine

## 2020-11-13 VITALS — BP 120/82 | HR 88 | Temp 98.2°F | Ht 64.0 in | Wt 231.0 lb

## 2020-11-13 DIAGNOSIS — Z23 Encounter for immunization: Secondary | ICD-10-CM | POA: Diagnosis not present

## 2020-11-13 DIAGNOSIS — Z1159 Encounter for screening for other viral diseases: Secondary | ICD-10-CM

## 2020-11-13 DIAGNOSIS — R3915 Urgency of urination: Secondary | ICD-10-CM

## 2020-11-13 DIAGNOSIS — E782 Mixed hyperlipidemia: Secondary | ICD-10-CM | POA: Diagnosis not present

## 2020-11-13 DIAGNOSIS — Z0000 Encounter for general adult medical examination without abnormal findings: Secondary | ICD-10-CM

## 2020-11-13 DIAGNOSIS — Z1211 Encounter for screening for malignant neoplasm of colon: Secondary | ICD-10-CM

## 2020-11-13 LAB — POCT URINALYSIS DIPSTICK
Bilirubin, UA: NEGATIVE
Glucose, UA: NEGATIVE
Ketones, UA: NEGATIVE
Nitrite, UA: NEGATIVE
Protein, UA: NEGATIVE
Spec Grav, UA: 1.015 (ref 1.010–1.025)
Urobilinogen, UA: 0.2 E.U./dL
pH, UA: 5 (ref 5.0–8.0)

## 2020-11-13 NOTE — Progress Notes (Signed)
Date:  11/13/2020   Name:  Marie Whitney   DOB:  08/25/1966   MRN:  [id number]   Chief Complaint: Annual Exam (breast exam no pap, can only do breast exam and labs )  Marie Whitney is a 54 y.o. female who presents today for her Complete Annual Exam. She feels well. She reports exercising none. She reports she is sleeping fairly well. Breast complaints - none.  Mammogram: scheduled for 11/19/20 DEXA: not due Pap smear: 11/2019 Colonoscopy: FIT done 04/2020  Immunization History  Administered Date(s) Administered  . Influenza Inj Mdck Quad Pf 12/15/2018  . Pneumococcal Polysaccharide-23 11/21/2018    Urinary Tract Infection  This is a new problem. The current episode started today. The patient is experiencing no pain. There has been no fever. Associated symptoms include urgency. Pertinent negatives include no frequency or hematuria. Associated symptoms comments: She just noticed this coming here today.. She has tried nothing for the symptoms.    Lab Results  Component Value Date   CREATININE 0.78 11/13/2019   BUN 17 11/13/2019   NA 138 11/13/2019   K 4.9 11/13/2019   CL 98 11/13/2019   CO2 24 11/13/2019   Lab Results  Component Value Date   CHOL 248 (H) 11/13/2019   HDL 48 11/13/2019   LDLCALC 181 (H) 11/13/2019   TRIG 108 11/13/2019   CHOLHDL 5.2 (H) 11/13/2019   Lab Results  Component Value Date   TSH 1.300 11/13/2019   No results found for: HGBA1C Lab Results  Component Value Date   WBC 5.1 11/13/2019   HGB 14.9 11/13/2019   HCT 43.7 11/13/2019   MCV 84 11/13/2019   PLT 404 11/13/2019   Lab Results  Component Value Date   ALT 37 (H) 11/13/2019   AST 33 11/13/2019   ALKPHOS 107 11/13/2019   BILITOT 0.3 11/13/2019     Review of Systems  Constitutional: Negative for fatigue, fever and unexpected weight change.  HENT: Negative for trouble swallowing.   Eyes: Negative for visual disturbance.  Respiratory: Negative for cough, chest tightness and  shortness of breath.   Cardiovascular: Negative for chest pain, palpitations and leg swelling.  Gastrointestinal: Negative for abdominal pain, constipation and diarrhea.  Genitourinary: Positive for urgency. Negative for dysuria, frequency and hematuria.  Musculoskeletal: Negative for arthralgias and gait problem.  Skin: Negative for color change and rash.       Skin tags  Neurological: Negative for dizziness and headaches.  Psychiatric/Behavioral: Positive for sleep disturbance. Negative for dysphoric mood and suicidal ideas. The patient is not nervous/anxious.     Patient Active Problem List   Diagnosis Date Noted  . GERD without esophagitis 03/15/2019  . Generalized anxiety disorder 01/12/2016  . Muscle spasm of back 11/12/2015  . Primary insomnia 11/12/2015  . Tinnitus of right ear 11/12/2015  . Mixed hyperlipidemia 09/28/2015  . Constipation due to slow transit 09/28/2015  . Herpes simplex disease 09/28/2015  . Allergic rhinitis 09/28/2015  . Hx of abnormal mammogram 09/28/2015    No Known Allergies  Past Surgical History:  Procedure Laterality Date  . none      Social History   Tobacco Use  . Smoking status: Never Smoker  . Smokeless tobacco: Never Used  Substance Use Topics  . Alcohol use: No    Alcohol/week: 2.0 standard drinks    Types: 2 Standard drinks or equivalent per week  . Drug use: Not on file     Medication list has been  reviewed and updated.  Current Meds  Medication Sig  . albuterol (PROVENTIL HFA;VENTOLIN HFA) 108 (90 Base) MCG/ACT inhaler Inhale 1-2 puffs into the lungs every 6 (six) hours as needed for wheezing or shortness of breath.  Marland Kitchen buPROPion (WELLBUTRIN XL) 150 MG 24 hr tablet Take 1 tablet (150 mg total) by mouth daily.  . cimetidine (TAGAMET) 400 MG tablet TAKE 1 TABLET BY MOUTH TWICE A DAY  . cyclobenzaprine (FLEXERIL) 10 MG tablet TAKE 1 TABLET BY MOUTH THREE TIMES A DAY AS NEEDED FOR MUSCLE SPASMS  . fexofenadine (ALLEGRA ALLERGY)  180 MG tablet Take 1 tablet (180 mg total) by mouth daily.  . hyoscyamine (LEVSIN SL) 0.125 MG SL tablet Place 1 tab on the tongue to dissolve, every 6 hours as needed for spasms.  . temazepam (RESTORIL) 15 MG capsule Take 1 capsule (15 mg total) by mouth at bedtime as needed for sleep.  . valACYclovir (VALTREX) 1000 MG tablet TAKE 1 TABLET BY MOUTH EVERY DAY    PHQ 2/9 Scores 11/13/2020 11/04/2020 01/15/2020 11/13/2019  PHQ - 2 Score 0 2 0 4  PHQ- 9 Score 0 6 2 9     GAD 7 : Generalized Anxiety Score 11/13/2020 11/04/2020 01/15/2020 03/15/2019  Nervous, Anxious, on Edge 0 2 0 3  Control/stop worrying 0 2 1 3   Worry too much - different things 0 2 1 3   Trouble relaxing 0 0 0 3  Restless 0 0 0 0  Easily annoyed or irritable 0 0 0 0  Afraid - awful might happen 0 0 0 3  Total GAD 7 Score 0 6 2 15   Anxiety Difficulty - Not difficult at all Not difficult at all -    BP Readings from Last 3 Encounters:  11/13/20 120/82  11/04/20 124/80  01/15/20 112/78    Physical Exam Vitals and nursing note reviewed.  Constitutional:      General: She is not in acute distress.    Appearance: She is well-developed.  HENT:     Head: Normocephalic and atraumatic.     Right Ear: Tympanic membrane and ear canal normal.     Left Ear: Tympanic membrane and ear canal normal.     Nose:     Right Sinus: No maxillary sinus tenderness.     Left Sinus: No maxillary sinus tenderness.  Eyes:     General: No scleral icterus.       Right eye: No discharge.        Left eye: No discharge.     Conjunctiva/sclera: Conjunctivae normal.  Neck:     Thyroid: No thyromegaly.     Vascular: No carotid bruit.  Cardiovascular:     Rate and Rhythm: Normal rate and regular rhythm.     Pulses: Normal pulses.     Heart sounds: Normal heart sounds. No murmur heard.   Pulmonary:     Effort: Pulmonary effort is normal. No respiratory distress.     Breath sounds: No wheezing.  Chest:     Breasts:        Right: No mass,  nipple discharge, skin change or tenderness.        Left: No mass, nipple discharge, skin change or tenderness.  Abdominal:     General: Bowel sounds are normal.     Palpations: Abdomen is soft.     Tenderness: There is no abdominal tenderness.  Musculoskeletal:     Cervical back: Normal range of motion. No erythema.     Right lower leg:  No edema.     Left lower leg: No edema.  Lymphadenopathy:     Cervical: No cervical adenopathy.  Skin:    General: Skin is warm and dry.     Capillary Refill: Capillary refill takes less than 2 seconds.     Findings: No rash.  Neurological:     General: No focal deficit present.     Mental Status: She is alert and oriented to person, place, and time.     Cranial Nerves: No cranial nerve deficit.     Sensory: No sensory deficit.     Deep Tendon Reflexes: Reflexes are normal and symmetric.  Psychiatric:        Attention and Perception: Attention normal.        Mood and Affect: Mood normal.        Behavior: Behavior normal.     Wt Readings from Last 3 Encounters:  11/13/20 231 lb (104.8 kg)  11/04/20 231 lb (104.8 kg)  01/15/20 220 lb (99.8 kg)    BP 120/82   Pulse 88   Temp 98.2 F (36.8 C) (Oral)   Ht 5\' 4"  (1.626 m)   Wt 231 lb (104.8 kg)   LMP 11/12/2017   SpO2 97%   BMI 39.65 kg/m   Assessment and Plan: 1. Annual physical exam Exam is normal except for weight. Encourage regular exercise and appropriate dietary changes. Consider medication if she obtains reliable transportation - CBC with Differential/Platelet - Comprehensive metabolic panel - TSH  2. Colon cancer screening - Ambulatory referral to Gastroenterology  3. Mixed hyperlipidemia Check labs and advise - Lipid panel  4. Urinary urgency UA is clear - urgency this am was transient - POCT urinalysis dipstick - UA negative  5. Need for hepatitis C screening test - Hepatitis C antibody  6. Need for diphtheria-tetanus-pertussis (Tdap) vaccine - Tdap vaccine  greater than or equal to 7yo IM   Partially dictated using 14/12/2016. Any errors are unintentional.  Animal nutritionist, MD [facility]  11/13/2020

## 2020-11-14 LAB — COMPREHENSIVE METABOLIC PANEL
ALT: 62 IU/L — ABNORMAL HIGH (ref 0–32)
AST: 49 IU/L — ABNORMAL HIGH (ref 0–40)
Albumin/Globulin Ratio: 1.3 (ref 1.2–2.2)
Albumin: 4.6 g/dL (ref 3.8–4.9)
Alkaline Phosphatase: 90 IU/L (ref 44–121)
BUN/Creatinine Ratio: 15 (ref 9–23)
BUN: 13 mg/dL (ref 6–24)
Bilirubin Total: 0.3 mg/dL (ref 0.0–1.2)
CO2: 21 mmol/L (ref 20–29)
Calcium: 9.8 mg/dL (ref 8.7–10.2)
Chloride: 98 mmol/L (ref 96–106)
Creatinine, Ser: 0.85 mg/dL (ref 0.57–1.00)
GFR calc Af Amer: 90 mL/min/{1.73_m2} (ref >59)
GFR calc non Af Amer: 78 mL/min/{1.73_m2} (ref >59)
Globulin, Total: 3.6 g/dL (ref 1.5–4.5)
Glucose: 103 mg/dL — ABNORMAL HIGH (ref 65–99)
Potassium: 4 mmol/L (ref 3.5–5.2)
Sodium: 137 mmol/L (ref 134–144)
Total Protein: 8.2 g/dL (ref 6.0–8.5)

## 2020-11-14 LAB — CBC WITH DIFFERENTIAL/PLATELET
Basophils Absolute: 0 10*3/uL (ref 0.0–0.2)
Basos: 1 %
EOS (ABSOLUTE): 0.1 10*3/uL (ref 0.0–0.4)
Eos: 2 %
Hematocrit: 42.3 % (ref 34.0–46.6)
Hemoglobin: 14.4 g/dL (ref 11.1–15.9)
Immature Grans (Abs): 0 10*3/uL (ref 0.0–0.1)
Immature Granulocytes: 0 %
Lymphocytes Absolute: 1.8 10*3/uL (ref 0.7–3.1)
Lymphs: 29 %
MCH: 28.2 pg (ref 26.6–33.0)
MCHC: 34 g/dL (ref 31.5–35.7)
MCV: 83 fL (ref 79–97)
Monocytes Absolute: 0.4 10*3/uL (ref 0.1–0.9)
Monocytes: 6 %
Neutrophils Absolute: 3.7 10*3/uL (ref 1.4–7.0)
Neutrophils: 62 %
Platelets: 357 10*3/uL (ref 150–450)
RBC: 5.11 x10E6/uL (ref 3.77–5.28)
RDW: 13 % (ref 11.7–15.4)
WBC: 6.1 10*3/uL (ref 3.4–10.8)

## 2020-11-14 LAB — LIPID PANEL
Chol/HDL Ratio: 5.2 ratio — ABNORMAL HIGH (ref 0.0–4.4)
Cholesterol, Total: 223 mg/dL — ABNORMAL HIGH (ref 100–199)
HDL: 43 mg/dL (ref >39)
LDL Chol Calc (NIH): 154 mg/dL — ABNORMAL HIGH (ref 0–99)
Triglycerides: 142 mg/dL (ref 0–149)
VLDL Cholesterol Cal: 26 mg/dL (ref 5–40)

## 2020-11-14 LAB — HEPATITIS C ANTIBODY: Hep C Virus Ab: <0.1 s/co ratio (ref 0.0–0.9)

## 2020-11-14 LAB — TSH: TSH: 2.8 u[IU]/mL (ref 0.450–4.500)

## 2020-11-18 ENCOUNTER — Telehealth: Payer: Self-pay

## 2020-11-18 NOTE — Telephone Encounter (Signed)
Copied from CRM [id number]. Topic: Quick Communication - See Telephone Encounter >> Nov 18, 2020  8:13 AM Aretta Nip wrote: CRM for notification. See Telephone encounter for: 11/18/20. Pt wants a fu from nurse for the dr to refill a med that I do not see  in list and wanting to know since she had appt on the12/2  does she need to have the appt on the 8th [phone]? Stating she does now have transportation and med could not be refill with out measurements, now you have from 2nd. Pt not wanting to share specfics so restating her question as she phrased.

## 2020-11-18 NOTE — Telephone Encounter (Signed)
Pt called again asking if she really needs to come in tomorrow for a wt check and refill on her weight medication.  She states she was just in on Dec 2 for a CPE and also a week or so before that .  She said she has been trying to get an answer for a couple of days.  She will call back in the morning to see if Dr. B needs her to come in tomorrow afternoon or not.

## 2020-11-18 NOTE — Telephone Encounter (Signed)
She has an appointment tomorrow.

## 2020-11-18 NOTE — Telephone Encounter (Signed)
Copied from CRM [id number]. Topic: Quick Communication - See Telephone Encounter >> Nov 18, 2020  8:13 AM Aretta Nip wrote: CRM for notification. See Telephone encounter for: 11/18/20. Pt wants a fu from nurse for the dr to refill a med that I do not see  in list and wanting to know since she had appt on the12/2  does she need to have the appt on the 8th [phone]? Stating she does now have transportation and med could not be refill with out measurements, now you have from 2nd. Pt not wanting to share specfics so restating her question as she phrased. >> Nov 18, 2020  3:48 PM Tamela Oddi wrote: Patient is calling again to speak with the nurse or doctor asap regarding a visit on tomorrow.  Patient stated that she really needs to speak with someone today.

## 2020-11-18 NOTE — Telephone Encounter (Signed)
Copied from CRM [id number]. Topic: General - Other >> Nov 17, 2020  4:57 PM Dalphine Handing A wrote: Patient would like a callback from Dr. Jaclynn Guarneri nurse in regards taking phentermine again. Patient has resolved transportation issue and wants to know if Dr. Judithann Graves wants her to book an appt to get back on medication or is she will just send in medication to pharmacy

## 2020-11-19 ENCOUNTER — Ambulatory Visit
Admission: RE | Admit: 2020-11-19 | Discharge: 2020-11-19 | Disposition: A | Discharge status: Home / Self Care | Payer: BC Managed Care – PPO | Source: Ambulatory Visit | Attending: Internal Medicine | Admitting: Internal Medicine

## 2020-11-19 ENCOUNTER — Other Ambulatory Visit: Payer: Self-pay | Admitting: Internal Medicine

## 2020-11-19 ENCOUNTER — Encounter: Payer: Self-pay | Admitting: Internal Medicine

## 2020-11-19 ENCOUNTER — Other Ambulatory Visit: Payer: Self-pay

## 2020-11-19 ENCOUNTER — Ambulatory Visit: Payer: BC Managed Care – PPO | Admitting: Internal Medicine

## 2020-11-19 VITALS — BP 134/72 | HR 99 | Ht 64.0 in | Wt 231.0 lb

## 2020-11-19 DIAGNOSIS — Z6839 Body mass index (BMI) 39.0-39.9, adult: Secondary | ICD-10-CM | POA: Diagnosis not present

## 2020-11-19 DIAGNOSIS — M62830 Muscle spasm of back: Secondary | ICD-10-CM

## 2020-11-19 DIAGNOSIS — Z1231 Encounter for screening mammogram for malignant neoplasm of breast: Secondary | ICD-10-CM | POA: Diagnosis not present

## 2020-11-19 DIAGNOSIS — L700 Acne vulgaris: Secondary | ICD-10-CM

## 2020-11-19 DIAGNOSIS — N760 Acute vaginitis: Secondary | ICD-10-CM

## 2020-11-19 MED ORDER — PHENTERMINE HCL 37.5 MG PO CAPS: 37.5000 mg | ORAL_CAPSULE | ORAL | 0 refills | Status: DC

## 2020-11-19 MED ORDER — FLUCONAZOLE 100 MG PO TABS: 100.0000 mg | ORAL_TABLET | Freq: Once | ORAL | 0 refills | Status: AC

## 2020-11-19 NOTE — Telephone Encounter (Signed)
She has an appointment today.  We can discuss then.

## 2020-11-19 NOTE — Progress Notes (Addendum)
Date:  11/19/2020   Name:  Marie Whitney   DOB:  [birth year]   MRN:  [id number]   Chief Complaint: Weight Gain  Vaginal Itching The patient's primary symptoms include genital itching. The patient's pertinent negatives include no genital odor or genital rash. This is a new problem. The current episode started in the past 7 days. The problem occurs constantly. The problem has been unchanged. The patient is experiencing no pain. The problem affects both sides. She is not pregnant. Associated symptoms include rash (on face). Pertinent negatives include no chills, dysuria, fever or headaches. Exacerbated by: recently started antibiotics. She has tried nothing for the symptoms.  Obesity - she is ready to start back on medication to help her lose weight.  She has used phentermine in the past but could not get back here for reliable follow up.  Now that her husband has finally agreed to a divorce and is leaving her a car, she will be able to return as needed. 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  No discharge, bleeding or pain.  Lab Results  Component Value Date   CREATININE 0.85 11/13/2020   BUN 13 11/13/2020   NA 137 11/13/2020   K 4.0 11/13/2020   CL 98 11/13/2020   CO2 21 11/13/2020   Lab Results  Component Value Date   CHOL 223 (H) 11/13/2020   HDL 43 11/13/2020   LDLCALC 154 (H) 11/13/2020   TRIG 142 11/13/2020   CHOLHDL 5.2 (H) 11/13/2020   Lab Results  Component Value Date   TSH 2.800 11/13/2020   No results found for: HGBA1C Lab Results  Component Value Date   WBC 6.1 11/13/2020   HGB 14.4 11/13/2020   HCT 42.3 11/13/2020   MCV 83 11/13/2020   PLT 357 11/13/2020   Lab Results  Component Value Date   ALT 62 (H) 11/13/2020   AST 49 (H) 11/13/2020   ALKPHOS 90 11/13/2020   BILITOT 0.3 11/13/2020     Review of Systems  Constitutional: Negative for chills, fatigue, fever and unexpected weight change.  Respiratory: Negative for cough  and chest tightness.   Cardiovascular: Negative for chest pain.  Genitourinary: Negative for dysuria and genital sores.  Skin: Positive for rash (on face).  Neurological: Negative for dizziness and headaches.  Psychiatric/Behavioral: Negative for dysphoric mood and sleep disturbance. The patient is not nervous/anxious.     Patient Active Problem List   Diagnosis Date Noted  . GERD without esophagitis 03/15/2019  . Generalized anxiety disorder 01/12/2016  . Muscle spasm of back 11/12/2015  . Primary insomnia 11/12/2015  . Tinnitus of right ear 11/12/2015  . Mixed hyperlipidemia 09/28/2015  . Constipation due to slow transit 09/28/2015  . Herpes simplex disease 09/28/2015  . Allergic rhinitis 09/28/2015  . Hx of abnormal mammogram 09/28/2015    No Known Allergies  Past Surgical History:  Procedure Laterality Date  . none      Social History   Tobacco Use  . Smoking status: Never Smoker  . Smokeless tobacco: Never Used  Substance Use Topics  . Alcohol use: No    Alcohol/week: 2.0 standard drinks    Types: 2 Standard drinks or equivalent per week  . Drug use: Not on file     Medication list has been reviewed and updated.  Current Meds  Medication Sig  . albuterol (PROVENTIL HFA;VENTOLIN HFA) 108 (90 Base) MCG/ACT inhaler Inhale 1-2 puffs into the lungs every 6 (  six) hours as needed for wheezing or shortness of breath.  Marland Kitchen buPROPion (WELLBUTRIN XL) 150 MG 24 hr tablet Take 1 tablet (150 mg total) by mouth daily.  . cimetidine (TAGAMET) 400 MG tablet TAKE 1 TABLET BY MOUTH TWICE A DAY  . cyclobenzaprine (FLEXERIL) 10 MG tablet TAKE 1 TABLET BY MOUTH THREE TIMES A DAY AS NEEDED FOR MUSCLE SPASMS  . Doxycycline Hyclate (DORYX MPC) 120 MG TBEC Take 1 tablet by mouth daily.  . fexofenadine (ALLEGRA ALLERGY) 180 MG tablet Take 1 tablet (180 mg total) by mouth daily.  . hyoscyamine (LEVSIN SL) 0.125 MG SL tablet Place 1 tab on the tongue to dissolve, every 6 hours as needed for  spasms.  . Tazarotene (ARAZLO) 0.045 % LOTN Apply 1 application topically daily.  . temazepam (RESTORIL) 15 MG capsule Take 1 capsule (15 mg total) by mouth at bedtime as needed for sleep.  . valACYclovir (VALTREX) 1000 MG tablet TAKE 1 TABLET BY MOUTH EVERY DAY  . [DISCONTINUED] doxycycline (MONODOX) 100 MG capsule Take 100 mg by mouth 2 (two) times daily.  . [DISCONTINUED] tazarotene (TAZORAC) 0.05 % cream Apply topically at bedtime.    PHQ 2/9 Scores 11/19/2020 11/13/2020 11/04/2020 01/15/2020  PHQ - 2 Score 0 0 2 0  PHQ- 9 Score 0 0 6 2    GAD 7 : Generalized Anxiety Score 11/19/2020 11/13/2020 11/04/2020 01/15/2020  Nervous, Anxious, on Edge 0 0 2 0  Control/stop worrying 0 0 2 1  Worry too much - different things 0 0 2 1  Trouble relaxing 0 0 0 0  Restless 0 0 0 0  Easily annoyed or irritable 0 0 0 0  Afraid - awful might happen 0 0 0 0  Total GAD 7 Score 0 0 6 2  Anxiety Difficulty Not difficult at all - Not difficult at all Not difficult at all    BP Readings from Last 3 Encounters:  11/19/20 134/72  11/13/20 120/82  11/04/20 124/80    Physical Exam Vitals and nursing note reviewed.  Constitutional:      General: She is not in acute distress.    Appearance: She is well-developed.  HENT:     Head: Normocephalic and atraumatic.  Cardiovascular:     Rate and Rhythm: Normal rate and regular rhythm.  Pulmonary:     Effort: Pulmonary effort is normal. No respiratory distress.     Breath sounds: No wheezing or rhonchi.  Musculoskeletal:     Cervical back: Normal range of motion.     Right lower leg: No edema.     Left lower leg: No edema.  Skin:    General: Skin is warm and dry.     Findings: No rash.  Neurological:     General: No focal deficit present.     Mental Status: She is alert and oriented to person, place, and time.  Psychiatric:        Mood and Affect: Mood normal.     Wt Readings from Last 3 Encounters:  11/19/20 231 lb (104.8 kg)  11/13/20 231 lb  (104.8 kg)  11/04/20 231 lb (104.8 kg)    BP 134/72   Pulse 99   Ht 5\' 4"  (1.626 m)   Wt 231 lb (104.8 kg)   LMP 11/12/2017   SpO2 97%   BMI 39.65 kg/m   Assessment and Plan: 1. Acute vaginitis May be due to recent start of Doxycycline - fluconazole (DIFLUCAN) 100 MG tablet; Take 1 tablet (100 mg  total) by mouth once for 1 dose.  Dispense: 1 tablet; Refill: 0  2. BMI 39.0-39.9,adult Begin phentermine; continue diet changes and exercise Follow up in 2-3 months - phentermine 37.5 MG capsule; Take 1 capsule (37.5 mg total) by mouth every morning.  Dispense: 90 capsule; Refill: 0  3. Acne Vulgaris Continue topical medication and doxycycline prescribed by Dermatology    Partially dictated using Dragon software. Any errors are unintentional.  Bari Edward, MD [facility]  11/19/2020

## 2020-11-19 NOTE — Addendum Note (Signed)
Addended by: Reubin Milan on: 11/19/2020 04:45 PM   Modules accepted: Orders

## 2020-11-21 ENCOUNTER — Telehealth: Payer: Self-pay

## 2020-11-21 NOTE — Telephone Encounter (Signed)
Copied from CRM [id number]. Topic: General - Other >> Nov 21, 2020  1:28 PM Tamela Oddi wrote: Reason for CRM: Patient needs to talk with someone regarding a code that she feels was not coded correctly.  Patient needs to speak with a Production designer, theatre/television/film or someone in the the administrative dept. Regarding the coding of her bill.  The insurance company is not able to pay the entire bill.  CB# [phone].  Patient also said she will call the billing dept. As well.

## 2020-12-06 ENCOUNTER — Other Ambulatory Visit: Payer: Self-pay | Admitting: Internal Medicine

## 2020-12-06 DIAGNOSIS — M62830 Muscle spasm of back: Secondary | ICD-10-CM

## 2021-01-16 ENCOUNTER — Ambulatory Visit: Payer: BC Managed Care – PPO | Admitting: Gastroenterology

## 2021-01-16 ENCOUNTER — Other Ambulatory Visit: Payer: Self-pay

## 2021-01-16 ENCOUNTER — Other Ambulatory Visit: Payer: Self-pay | Admitting: Gastroenterology

## 2021-01-16 ENCOUNTER — Telehealth: Payer: Self-pay | Admitting: Internal Medicine

## 2021-01-16 ENCOUNTER — Encounter: Payer: Self-pay | Admitting: Gastroenterology

## 2021-01-16 VITALS — BP 130/88 | HR 105 | Temp 98.4°F | Ht 64.0 in | Wt 234.1 lb

## 2021-01-16 DIAGNOSIS — R109 Unspecified abdominal pain: Secondary | ICD-10-CM | POA: Diagnosis not present

## 2021-01-16 DIAGNOSIS — R198 Other specified symptoms and signs involving the digestive system and abdomen: Secondary | ICD-10-CM | POA: Diagnosis not present

## 2021-01-16 DIAGNOSIS — G8929 Other chronic pain: Secondary | ICD-10-CM

## 2021-01-16 DIAGNOSIS — K219 Gastro-esophageal reflux disease without esophagitis: Secondary | ICD-10-CM

## 2021-01-16 DIAGNOSIS — Z1211 Encounter for screening for malignant neoplasm of colon: Secondary | ICD-10-CM

## 2021-01-16 DIAGNOSIS — K5901 Slow transit constipation: Secondary | ICD-10-CM

## 2021-01-16 DIAGNOSIS — K6289 Other specified diseases of anus and rectum: Secondary | ICD-10-CM | POA: Diagnosis not present

## 2021-01-16 MED ORDER — MAGNESIUM CITRATE PO SOLN: 1.0000 | Freq: Once | ORAL | 0 refills | Status: AC

## 2021-01-16 MED ORDER — HYOSCYAMINE SULFATE 0.125 MG SL SUBL: SUBLINGUAL_TABLET | SUBLINGUAL | 1 refills | Status: DC

## 2021-01-16 MED ORDER — CIMETIDINE 400 MG PO TABS
400.0000 mg | ORAL_TABLET | Freq: Two times a day (BID) | ORAL | 0 refills | Status: DC
Start: 2021-01-16 — End: 2021-04-06

## 2021-01-16 MED ORDER — NA SULFATE-K SULFATE-MG SULF 17.5-3.13-1.6 GM/177ML PO SOLN: 354.0000 mL | Freq: Once | ORAL | 0 refills | Status: AC

## 2021-01-16 NOTE — Telephone Encounter (Signed)
Patient would like the nurse to call regarding her medication for temazepam (RESTORIL) 15 MG capsule.  She is wanting a 90-day supply because of insurance issues.  Please call patient to discuss at [phone]

## 2021-01-16 NOTE — Progress Notes (Signed)
Arlyss Repress, MD [address]  [address]  [place], [place] [address]  Main: [phone]  Fax: [phone]    Gastroenterology Consultation  Referring Provider:     Reubin Milan, MD Primary Care Physician:  Reubin Milan, MD Primary Gastroenterologist:  Dr. Arlyss Repress Reason for Consultation:     Abdominal spasm, chronic GERD, rectal pain and colon cancer screening        HPI:   Marie Whitney is a 55 y.o. female referred by Dr. Judithann Graves, Nyoka Cowden, MD  for consultation & management of chronic history of abdominal spasms, patient was previously treated by 2 different gastroenterologist within last 10 to 15 years and she was told that she has irritable bowel syndrome, was given dicyclomine for abdominal spasm.  She describes the spasms as generalized, intermittent only.  Patient reports that she has gained about 50 pounds within last 5 years secondary to stress that she was going through marriage and divorce.  She also reports a sudden onset of intermittent very sharp rectal pain not associated with bowel movement, this pain wakes her up from sleep.  She does report significant straining during bowel movement associated with incomplete emptying as well.  She reports her bowel frequency is about 4 days a week.  She does have cholelithiasis based on ultrasound in 2016.  She denies any right upper quadrant pain or epigastric pain or symptoms triggered after a meal.  She also reports chronic heartburn for which she has been taking cimetidine once a day.  NSAIDs: None  Antiplts/Anticoagulants/Anti thrombotics: None  GI Procedures: None  History reviewed. No pertinent past medical history.  Past Surgical History:  Procedure Laterality Date  . none      Current Outpatient Medications:  .  albuterol (PROVENTIL HFA;VENTOLIN HFA) 108 (90 Base) MCG/ACT inhaler, Inhale 1-2 puffs into the lungs every 6 (six) hours as needed for wheezing or shortness of breath.,  Disp: 1 Inhaler, Rfl: 0 .  buPROPion (WELLBUTRIN XL) 150 MG 24 hr tablet, Take 1 tablet (150 mg total) by mouth daily., Disp: 90 tablet, Rfl: 1 .  cimetidine (TAGAMET) 400 MG tablet, TAKE 1 TABLET BY MOUTH TWICE A DAY, Disp: 180 tablet, Rfl: 0 .  cyclobenzaprine (FLEXERIL) 10 MG tablet, TAKE 1 TABLET BY MOUTH THREE TIMES A DAY AS NEEDED FOR MUSCLE SPASMS, Disp: 270 tablet, Rfl: 0 .  Doxycycline Hyclate (DORYX MPC) 120 MG TBEC, Take 1 tablet by mouth daily., Disp: , Rfl:  .  fexofenadine (ALLEGRA ALLERGY) 180 MG tablet, Take 1 tablet (180 mg total) by mouth daily., Disp: 90 tablet, Rfl: 3 .  finasteride (PROSCAR) 5 MG tablet, Take 5 mg by mouth daily., Disp: , Rfl:  .  hyoscyamine (LEVSIN SL) 0.125 MG SL tablet, Place 1 tab on the tongue to dissolve, every 6 hours as needed for spasms., Disp: 90 tablet, Rfl: 1 .  magnesium citrate SOLN, Take 296 mLs (1 Bottle total) by mouth once for 1 dose., Disp: 195 mL, Rfl: 0 .  Na Sulfate-K Sulfate-Mg Sulf 17.5-3.13-1.6 GM/177ML SOLN, Take 354 mLs by mouth once for 1 dose., Disp: 354 mL, Rfl: 0 .  phentermine 37.5 MG capsule, Take 1 capsule (37.5 mg total) by mouth every morning., Disp: 90 capsule, Rfl: 0 .  Tazarotene (ARAZLO) 0.045 % LOTN, Apply 1 application topically daily., Disp: , Rfl:  .  temazepam (RESTORIL) 15 MG capsule, Take 1 capsule (15 mg total) by mouth at bedtime as needed for sleep.,  Disp: 30 capsule, Rfl: 5 .  valACYclovir (VALTREX) 1000 MG tablet, TAKE 1 TABLET BY MOUTH EVERY DAY, Disp: 90 tablet, Rfl: 0 .  Vitamin D, Ergocalciferol, (DRISDOL) 1.25 MG (50000 UNIT) CAPS capsule, Take 50,000 Units by mouth once a week., Disp: , Rfl:    Family History  Problem Relation Age of Onset  . CAD Mother   . Breast cancer Neg Hx      Social History   Tobacco Use  . Smoking status: Never Smoker  . Smokeless tobacco: Never Used  Substance Use Topics  . Alcohol use: No    Alcohol/week: 2.0 standard drinks    Types: 2 Standard drinks or  equivalent per week    Allergies as of 01/16/2021  . (No Known Allergies)    Review of Systems:    All systems reviewed and negative except where noted in HPI.   Physical Exam:  BP 130/88 (BP Location: Left Arm, Patient Position: Sitting, Cuff Size: Normal)   Pulse (!) 105   Temp 98.4 F (36.9 C) (Oral)   Ht 5\' 4"  (1.626 m)   Wt 234 lb 2 oz (106.2 kg)   LMP 11/12/2017   BMI 40.19 kg/m  Patient's last menstrual period was 11/12/2017.  General:   Alert,  Well-developed, well-nourished, pleasant and cooperative in NAD Head:  Normocephalic and atraumatic. Eyes:  Sclera clear, no icterus.   Conjunctiva pink. Ears:  Normal auditory acuity. Nose:  No deformity, discharge, or lesions. Mouth:  No deformity or lesions,oropharynx pink & moist. Neck:  Supple; no masses or thyromegaly. Lungs:  Respirations even and unlabored.  Clear throughout to auscultation.   No wheezes, crackles, or rhonchi. No acute distress. Heart:  Regular rate and rhythm; no murmurs, clicks, rubs, or gallops. Abdomen:  Normal bowel sounds. Soft, obese, non-tender and non-distended without masses, hepatosplenomegaly or hernias noted.  No guarding or rebound tenderness.   Rectal: Not performed Msk:  Symmetrical without gross deformities. Good, equal movement & strength bilaterally. Pulses:  Normal pulses noted. Extremities:  No clubbing or edema.  No cyanosis. Neurologic:  Alert and oriented x3;  grossly normal neurologically. Skin:  Intact without significant lesions or rashes. No jaundice. Psych:  Alert and cooperative. Normal mood and affect.  Imaging Studies: Reviewed  Assessment and Plan:   Marie Whitney is a 55 y.o. female with history of generalized anxiety, history of chronic GERD, history of constipation, history of chronic abdominal spasm, intermittent sharp rectal pain  Chronic GERD Patient has been on cimetidine for several years Recommend EGD for Barrett's screening  Chronic  constipation Trial of Linzess 145 MCG daily, samples provided  Abdominal spasm Patient is requesting dicyclomine or hyoscyamine, will refill.  Cautioned her about worsening of constipation  Colon cancer screening Recommend colonoscopy   Follow up in 3 months   57, MD

## 2021-01-19 ENCOUNTER — Telehealth: Payer: Self-pay | Admitting: Gastroenterology

## 2021-01-19 ENCOUNTER — Other Ambulatory Visit: Payer: Self-pay | Admitting: Gastroenterology

## 2021-01-19 ENCOUNTER — Other Ambulatory Visit: Payer: BC Managed Care – PPO

## 2021-01-19 DIAGNOSIS — R109 Unspecified abdominal pain: Secondary | ICD-10-CM

## 2021-01-19 MED ORDER — CLENPIQ 10-3.5-12 MG-GM -GM/160ML PO SOLN: 320.0000 mL | Freq: Once | ORAL | 0 refills | Status: AC

## 2021-01-19 MED ORDER — LINACLOTIDE 145 MCG PO CAPS: 145.0000 ug | ORAL_CAPSULE | Freq: Every day | ORAL | 0 refills | Status: DC

## 2021-01-19 MED ORDER — GOLYTELY 236 G PO SOLR: 4000.0000 mL | Freq: Once | ORAL | 0 refills | Status: AC

## 2021-01-19 NOTE — Telephone Encounter (Signed)
Patient called LM on VM, states she needs to speak to Dr Verdis Prime assistant regarding medications.

## 2021-01-19 NOTE — Telephone Encounter (Signed)
Yes ° °RV °

## 2021-01-19 NOTE — Addendum Note (Signed)
Addended by: Radene Knee L on: 01/19/2021 11:32 AM   Modules accepted: Orders

## 2021-01-19 NOTE — Telephone Encounter (Signed)
Sent medication

## 2021-01-19 NOTE — Telephone Encounter (Signed)
Please review. Last office visit 11/19/2020.  KP

## 2021-01-19 NOTE — Telephone Encounter (Signed)
I only do 30 days on sleeping pills.

## 2021-01-19 NOTE — Telephone Encounter (Signed)
Pt states she can get a better price and pharm has special agreement with her [employer] insurance . Pt would like 90 day instead of 30 day

## 2021-01-19 NOTE — Telephone Encounter (Signed)
Patient states the Linzess made her have normal bowel movements. She would like a 90 day supply of medication. Can I send this to the pharmacy

## 2021-01-19 NOTE — Telephone Encounter (Signed)
Spoke with patient and informed her of Dr. Judithann Graves only prescribing 30 days at a time for controlled substance medications. She verbalized understanding and said she will tell her pharmacist.

## 2021-01-20 ENCOUNTER — Telehealth: Payer: Self-pay

## 2021-01-20 NOTE — Telephone Encounter (Signed)
Patient states she cancel her procedure because she does not have a person to come with her to the procedure. She states she will call us back to rescheduled when she has a ride.

## 2021-01-21 ENCOUNTER — Encounter: Admission: RE | Payer: Self-pay | Source: Home / Self Care

## 2021-01-21 ENCOUNTER — Ambulatory Visit
Admission: RE | Admit: 2021-01-21 | Payer: BC Managed Care – PPO | Source: Home / Self Care | Admitting: Gastroenterology

## 2021-01-21 SURGERY — COLONOSCOPY WITH PROPOFOL
Anesthesia: General

## 2021-01-22 ENCOUNTER — Telehealth: Payer: Self-pay | Admitting: Gastroenterology

## 2021-01-22 NOTE — Telephone Encounter (Signed)
Patient called LM on VM- she does not have a ride for Colonosocpy procedure, she wants to know if she can have a CT of her abdomen instead.  Patient reports more steady bowel movements since her visit.  Please call to advise.

## 2021-01-23 NOTE — Telephone Encounter (Signed)
Patient called Marie Whitney on VM .  Patient wants to discuss other alternatives for testing other than a Colonoscopy as she does not have transportation for the procedure, please call to advise.

## 2021-01-26 NOTE — Telephone Encounter (Signed)
Patient states she asked if there is anything else she can do instead of a colonoscopy. Informed patient that the colonoscopy is the best way to tell if she has any polyps or colon cancer. Informed patient the hemoccult stool test and cologuard are not as  Accurate as the colonoscopy. She verbalized understanding. She states she is still having  Chronic abdominal spasm and wants to know if she needs a colonoscopy.

## 2021-01-26 NOTE — Telephone Encounter (Signed)
Patient verbalized understanding. States she will call us back when she can find a ride

## 2021-01-26 NOTE — Telephone Encounter (Signed)
Agree, I highly recommend colonoscopy as she is past due by 9 years for colon cancer screening  RV

## 2021-02-06 ENCOUNTER — Other Ambulatory Visit: Payer: Self-pay | Admitting: Internal Medicine

## 2021-02-06 DIAGNOSIS — K219 Gastro-esophageal reflux disease without esophagitis: Secondary | ICD-10-CM

## 2021-02-06 DIAGNOSIS — M62830 Muscle spasm of back: Secondary | ICD-10-CM

## 2021-02-06 NOTE — Telephone Encounter (Signed)
Requested medication (s) are due for refill today: yes  Requested medication (s) are on the active medication list: yes  Last refill: 12/07/20  Future visit scheduled:  no  Notes to clinic:  not delegated   Requested Prescriptions  Pending Prescriptions Disp Refills   cyclobenzaprine (FLEXERIL) 10 MG tablet [Pharmacy Med Name: CYCLOBENZAPRINE 10 MG TABLET] 270 tablet 0    Sig: TAKE 1 TABLET BY MOUTH THREE TIMES A DAY AS NEEDED FOR MUSCLE SPASMS      Not Delegated - Analgesics:  Muscle Relaxants Failed - 02/06/2021 12:34 PM      Failed - This refill cannot be delegated      Passed - Valid encounter within last 6 months    Recent Outpatient Visits           2 months ago Acute vaginitis   [facility] Reubin Milan, MD   2 months ago Annual physical exam   [facility] Reubin Milan, MD   3 months ago Generalized anxiety disorder   [facility] Reubin Milan, MD   1 year ago BMI 37.0-37.9, adult   [facility] Reubin Milan, MD   1 year ago Encounter for screening mammogram for breast cancer   [facility] Reubin Milan, MD       Future Appointments             In 2 months Vanga, Loel Dubonnet, MD [facility] [place]

## 2021-02-13 ENCOUNTER — Other Ambulatory Visit: Payer: Self-pay | Admitting: Gastroenterology

## 2021-02-13 DIAGNOSIS — R109 Unspecified abdominal pain: Secondary | ICD-10-CM

## 2021-02-16 NOTE — Telephone Encounter (Signed)
Last office visit 01/16/2021 Irregular bowel habits  Last refill 01/16/2021 1 refills

## 2021-03-09 ENCOUNTER — Other Ambulatory Visit: Payer: Self-pay | Admitting: Internal Medicine

## 2021-03-09 DIAGNOSIS — M62830 Muscle spasm of back: Secondary | ICD-10-CM

## 2021-03-09 DIAGNOSIS — F411 Generalized anxiety disorder: Secondary | ICD-10-CM

## 2021-03-09 NOTE — Telephone Encounter (Signed)
Requested medication (s) are due for refill today:yes  Requested medication (s) are on the active medication list: yes  Last refill: 11/10/20  #270  0 refills  Future visit scheduled no  Notes to clinic: not delegated  Requested Prescriptions  Pending Prescriptions Disp Refills   cyclobenzaprine (FLEXERIL) 10 MG tablet [Pharmacy Med Name: CYCLOBENZAPRINE 10 MG TABLET] 270 tablet 0    Sig: TAKE 1 TABLET BY MOUTH THREE TIMES A DAY AS NEEDED FOR MUSCLE SPASMS      Not Delegated - Analgesics:  Muscle Relaxants Failed - 03/09/2021  6:41 PM      Failed - This refill cannot be delegated      Passed - Valid encounter within last 6 months    Recent Outpatient Visits           3 months ago Acute vaginitis   [facility] Reubin Milan, MD   3 months ago Annual physical exam   [facility] Reubin Milan, MD   4 months ago Generalized anxiety disorder   [facility] Reubin Milan, MD   1 year ago BMI 37.0-37.9, adult   [facility] Reubin Milan, MD   1 year ago Encounter for screening mammogram for breast cancer   [facility] Reubin Milan, MD       Future Appointments             In 1 month Vanga, Loel Dubonnet, MD [facility] [place]              Refused Prescriptions Disp Refills   buPROPion (WELLBUTRIN XL) 150 MG 24 hr tablet [Pharmacy Med Name: BUPROPION HCL XL 150 MG TABLET] 90 tablet 1    Sig: TAKE 1 TABLET BY MOUTH EVERY DAY      Psychiatry: Antidepressants - bupropion Passed - 03/09/2021  6:41 PM      Passed - Last BP in normal range    BP Readings from Last 1 Encounters:  01/16/21 130/88          Passed - Valid encounter within last 6 months    Recent Outpatient Visits           3 months ago Acute vaginitis   [facility] Reubin Milan, MD   3 months ago Annual physical exam   [facility] Reubin Milan, MD   4 months ago Generalized anxiety disorder   [facility] Reubin Milan, MD   1 year ago BMI 37.0-37.9, adult   [facility] Reubin Milan, MD   1 year ago Encounter for screening mammogram for breast cancer   [facility] Reubin Milan, MD       Future Appointments             In 1 month Vanga, Loel Dubonnet, MD [facility] [place]

## 2021-03-16 ENCOUNTER — Other Ambulatory Visit: Payer: Self-pay | Admitting: Internal Medicine

## 2021-03-16 DIAGNOSIS — B009 Herpesviral infection, unspecified: Secondary | ICD-10-CM

## 2021-04-01 ENCOUNTER — Ambulatory Visit: Payer: BC Managed Care – PPO | Attending: Otolaryngology

## 2021-04-01 DIAGNOSIS — G4733 Obstructive sleep apnea (adult) (pediatric): Secondary | ICD-10-CM | POA: Insufficient documentation

## 2021-04-02 ENCOUNTER — Other Ambulatory Visit: Payer: Self-pay

## 2021-04-05 ENCOUNTER — Other Ambulatory Visit: Payer: Self-pay | Admitting: Internal Medicine

## 2021-04-05 ENCOUNTER — Other Ambulatory Visit: Payer: Self-pay | Admitting: Gastroenterology

## 2021-04-05 DIAGNOSIS — F411 Generalized anxiety disorder: Secondary | ICD-10-CM

## 2021-04-05 DIAGNOSIS — M62830 Muscle spasm of back: Secondary | ICD-10-CM

## 2021-04-05 DIAGNOSIS — K219 Gastro-esophageal reflux disease without esophagitis: Secondary | ICD-10-CM

## 2021-04-05 DIAGNOSIS — B009 Herpesviral infection, unspecified: Secondary | ICD-10-CM

## 2021-04-05 DIAGNOSIS — R109 Unspecified abdominal pain: Secondary | ICD-10-CM

## 2021-04-19 ENCOUNTER — Other Ambulatory Visit: Payer: Self-pay | Admitting: Internal Medicine

## 2021-04-19 ENCOUNTER — Other Ambulatory Visit: Payer: Self-pay | Admitting: Gastroenterology

## 2021-04-19 DIAGNOSIS — M62830 Muscle spasm of back: Secondary | ICD-10-CM

## 2021-04-19 DIAGNOSIS — R109 Unspecified abdominal pain: Secondary | ICD-10-CM

## 2021-04-19 NOTE — Telephone Encounter (Signed)
Requested medication (s) are due for refill today: no too soon  Requested medication (s) are on the active medication list: yes  Last refill:  04/05/21  Future visit scheduled: no  Notes to clinic:  NT not delegated to refuse this med   Requested Prescriptions  Pending Prescriptions Disp Refills   cyclobenzaprine (FLEXERIL) 10 MG tablet [Pharmacy Med Name: CYCLOBENZAPRINE 10 MG TABLET] 270 tablet 0    Sig: TAKE 1 TABLET BY MOUTH THREE TIMES A DAY AS NEEDED FOR MUSCLE SPASMS      Not Delegated - Analgesics:  Muscle Relaxants Failed - 04/19/2021  3:05 PM      Failed - This refill cannot be delegated      Passed - Valid encounter within last 6 months    Recent Outpatient Visits           5 months ago Acute vaginitis   [facility] Reubin Milan, MD   5 months ago Annual physical exam   [facility] Reubin Milan, MD   5 months ago Generalized anxiety disorder   [facility] Reubin Milan, MD   1 year ago BMI 37.0-37.9, adult   [facility] Reubin Milan, MD   1 year ago Encounter for screening mammogram for breast cancer   [facility] Reubin Milan, MD       Future Appointments             In 1 week Vanga, Loel Dubonnet, MD [facility] [place]

## 2021-04-28 ENCOUNTER — Institutional Professional Consult (permissible substitution): Payer: BC Managed Care – PPO | Admitting: Adult Health

## 2021-04-29 ENCOUNTER — Other Ambulatory Visit: Payer: Self-pay

## 2021-04-30 ENCOUNTER — Ambulatory Visit: Payer: BC Managed Care – PPO | Admitting: Gastroenterology

## 2021-05-12 ENCOUNTER — Other Ambulatory Visit: Payer: Self-pay | Admitting: Gastroenterology

## 2021-05-12 ENCOUNTER — Other Ambulatory Visit: Payer: Self-pay | Admitting: Internal Medicine

## 2021-05-12 DIAGNOSIS — K219 Gastro-esophageal reflux disease without esophagitis: Secondary | ICD-10-CM

## 2021-05-12 DIAGNOSIS — R109 Unspecified abdominal pain: Secondary | ICD-10-CM

## 2021-05-12 DIAGNOSIS — M62830 Muscle spasm of back: Secondary | ICD-10-CM

## 2021-05-12 NOTE — Telephone Encounter (Signed)
Requested medication (s) are due for refill today: no  Requested medication (s) are on the active medication list: yes  Last refill:  04/05/21 #270  Future visit scheduled: no  Notes to clinic:  med not delegated to NT to RF   Requested Prescriptions  Pending Prescriptions Disp Refills   cyclobenzaprine (FLEXERIL) 10 MG tablet [Pharmacy Med Name: CYCLOBENZAPRINE 10 MG TABLET] 270 tablet 0    Sig: TAKE 1 TABLET BY MOUTH THREE TIMES A DAY AS NEEDED FOR MUSCLE SPASMS      Not Delegated - Analgesics:  Muscle Relaxants Failed - 05/12/2021 12:30 PM      Failed - This refill cannot be delegated      Passed - Valid encounter within last 6 months    Recent Outpatient Visits           5 months ago Acute vaginitis   [facility] Reubin Milan, MD   6 months ago Annual physical exam   [facility] Reubin Milan, MD   6 months ago Generalized anxiety disorder   [facility] Reubin Milan, MD   1 year ago BMI 37.0-37.9, adult   [facility] Reubin Milan, MD   1 year ago Encounter for screening mammogram for breast cancer   [facility] Reubin Milan, MD

## 2021-05-25 ENCOUNTER — Telehealth: Payer: Self-pay

## 2021-05-25 NOTE — Telephone Encounter (Signed)
Copied from CRM [id number]. Topic: General - Other >> May 22, 2021  4:09 PM Herby Abraham C wrote: Reason for CRM: pt called in for assistance. Pt says that she is due to have her medication follow up visit. 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 Pt would like to be sure of the billing for visits? Pt declined scheduling until being advised further on if they would be separate visits/billing?    [phone]

## 2021-05-25 NOTE — Telephone Encounter (Signed)
Please call pt to schedule an appt she can do a med refill and speak about varicose veins at the same visit. Only CPE you cant discuss anything else. It will be on one bill.  KP

## 2021-05-28 ENCOUNTER — Ambulatory Visit: Payer: BC Managed Care – PPO | Admitting: Internal Medicine

## 2021-05-28 ENCOUNTER — Telehealth: Payer: Self-pay

## 2021-05-28 ENCOUNTER — Other Ambulatory Visit: Payer: Self-pay

## 2021-05-28 ENCOUNTER — Encounter: Payer: Self-pay | Admitting: Internal Medicine

## 2021-05-28 VITALS — BP 126/82 | HR 75 | Temp 98.3°F | Ht 64.0 in | Wt 239.0 lb

## 2021-05-28 DIAGNOSIS — R1013 Epigastric pain: Secondary | ICD-10-CM

## 2021-05-28 DIAGNOSIS — N951 Menopausal and female climacteric states: Secondary | ICD-10-CM | POA: Diagnosis not present

## 2021-05-28 DIAGNOSIS — Z1211 Encounter for screening for malignant neoplasm of colon: Secondary | ICD-10-CM | POA: Diagnosis not present

## 2021-05-28 DIAGNOSIS — M25473 Effusion, unspecified ankle: Secondary | ICD-10-CM

## 2021-05-28 NOTE — Telephone Encounter (Signed)
Went into room to discharge patient for her visit today. She said she is having severe hot flashes at night.   She asked if she can be referred to a endocrinologist for this or if there is something you recommend for her to take for this?  Please advise.

## 2021-05-28 NOTE — Progress Notes (Signed)
Date:  05/28/2021   Name:  Marie Whitney   DOB:  1966/12/04   MRN:  [id number]   Chief Complaint: Flank Pain (X2 month, left side, constant pain, sharp pain, when she sleeps on her left side at night ), Varicose Veins (X both legs, stand for a long period of time they pop out more, painful wants referral to vein and vascular), and nigth sweats (Getting worse, going thru menopause )  Leg Pain  There was no injury mechanism. The pain is present in the left leg and right leg. The quality of the pain is described as aching. The pain is mild. The pain has been Fluctuating since onset. The symptoms are aggravated by weight bearing.  Abdominal Pain This is a recurrent problem. The pain is located in the LUQ. The quality of the pain is aching and dull. Pertinent negatives include no constipation, diarrhea, fever, headaches, nausea or vomiting.  Hot flashes/sweats at night - interrupting sleep.  Postmenopausal. Lab Results  Component Value Date   CREATININE 0.85 11/13/2020   BUN 13 11/13/2020   NA 137 11/13/2020   K 4.0 11/13/2020   CL 98 11/13/2020   CO2 21 11/13/2020   Lab Results  Component Value Date   CHOL 223 (H) 11/13/2020   HDL 43 11/13/2020   LDLCALC 154 (H) 11/13/2020   TRIG 142 11/13/2020   CHOLHDL 5.2 (H) 11/13/2020   Lab Results  Component Value Date   TSH 2.800 11/13/2020   No results found for: HGBA1C Lab Results  Component Value Date   WBC 6.1 11/13/2020   HGB 14.4 11/13/2020   HCT 42.3 11/13/2020   MCV 83 11/13/2020   PLT 357 11/13/2020   Lab Results  Component Value Date   ALT 62 (H) 11/13/2020   AST 49 (H) 11/13/2020   ALKPHOS 90 11/13/2020   BILITOT 0.3 11/13/2020     Review of Systems  Constitutional:  Positive for diaphoresis (sweats at night). Negative for appetite change, chills, fatigue and fever.  HENT:  Negative for trouble swallowing.   Respiratory:  Negative for cough, chest tightness and shortness of breath.   Cardiovascular:   Positive for leg swelling. Negative for chest pain and palpitations.  Gastrointestinal:  Positive for abdominal pain. Negative for blood in stool, constipation, diarrhea, nausea and vomiting.  Neurological:  Negative for dizziness, light-headedness and headaches.  Psychiatric/Behavioral:  Negative for decreased concentration and dysphoric mood. The patient is not nervous/anxious.    Patient Active Problem List   Diagnosis Date Noted   Acne vulgaris 11/19/2020   GERD without esophagitis 03/15/2019   Generalized anxiety disorder 01/12/2016   Muscle spasm of back 11/12/2015   Primary insomnia 11/12/2015   Tinnitus of right ear 11/12/2015   Mixed hyperlipidemia 09/28/2015   Constipation due to slow transit 09/28/2015   Herpes simplex disease 09/28/2015   Allergic rhinitis 09/28/2015   Hx of abnormal mammogram 09/28/2015    No Known Allergies  Past Surgical History:  Procedure Laterality Date   none      Social History   Tobacco Use   Smoking status: Never   Smokeless tobacco: Never  Substance Use Topics   Alcohol use: No    Alcohol/week: 2.0 standard drinks    Types: 2 Standard drinks or equivalent per week     Medication list has been reviewed and updated.  Current Meds  Medication Sig   albuterol (PROVENTIL HFA;VENTOLIN HFA) 108 (90 Base) MCG/ACT inhaler Inhale 1-2 puffs into  the lungs every 6 (six) hours as needed for wheezing or shortness of breath.   buPROPion (WELLBUTRIN XL) 150 MG 24 hr tablet TAKE 1 TABLET BY MOUTH EVERY DAY   cimetidine (TAGAMET) 400 MG tablet TAKE 1 TABLET BY MOUTH TWICE A DAY   cyclobenzaprine (FLEXERIL) 10 MG tablet TAKE 1 TABLET BY MOUTH THREE TIMES A DAY AS NEEDED FOR MUSCLE SPASMS   fexofenadine (ALLEGRA ALLERGY) 180 MG tablet Take 1 tablet (180 mg total) by mouth daily.   finasteride (PROSCAR) 5 MG tablet Take 5 mg by mouth daily.   fluticasone (FLONASE) 50 MCG/ACT nasal spray Place 2 sprays into both nostrils daily.   hyoscyamine (LEVSIN  SL) 0.125 MG SL tablet PLACE 1 TAB ON THE TONGUE TO DISSOLVE, EVERY 6 HOURS AS NEEDED FOR SPASMS.   levalbuterol (XOPENEX HFA) 45 MCG/ACT inhaler SMARTSIG:2 Puff(s) Via Inhaler Every 8 Hours PRN   LINZESS 145 MCG CAPS capsule TAKE 1 CAPSULE BY MOUTH DAILY BEFORE BREAKFAST.   phentermine 37.5 MG capsule Take 1 capsule (37.5 mg total) by mouth every morning.   Tazarotene (ARAZLO) 0.045 % LOTN Apply 1 application topically daily.   temazepam (RESTORIL) 15 MG capsule Take 1 capsule (15 mg total) by mouth at bedtime as needed for sleep.   valACYclovir (VALTREX) 1000 MG tablet TAKE 1 TABLET BY MOUTH EVERY DAY   Vitamin D, Ergocalciferol, (DRISDOL) 1.25 MG (50000 UNIT) CAPS capsule Take 50,000 Units by mouth once a week.    PHQ 2/9 Scores 11/19/2020 11/13/2020 11/04/2020 01/15/2020  PHQ - 2 Score 0 0 2 0  PHQ- 9 Score 0 0 6 2    GAD 7 : Generalized Anxiety Score 11/19/2020 11/13/2020 11/04/2020 01/15/2020  Nervous, Anxious, on Edge 0 0 2 0  Control/stop worrying 0 0 2 1  Worry too much - different things 0 0 2 1  Trouble relaxing 0 0 0 0  Restless 0 0 0 0  Easily annoyed or irritable 0 0 0 0  Afraid - awful might happen 0 0 0 0  Total GAD 7 Score 0 0 6 2  Anxiety Difficulty Not difficult at all - Not difficult at all Not difficult at all    BP Readings from Last 3 Encounters:  05/28/21 126/82  01/16/21 130/88  11/19/20 134/72    Physical Exam Vitals and nursing note reviewed.  Constitutional:      General: She is not in acute distress.    Appearance: Normal appearance. She is well-developed.  HENT:     Head: Normocephalic and atraumatic.  Cardiovascular:     Rate and Rhythm: Normal rate and regular rhythm.     Pulses: Normal pulses.  Pulmonary:     Effort: Pulmonary effort is normal. No respiratory distress.     Breath sounds: No wheezing or rhonchi.  Abdominal:     General: Abdomen is protuberant. Bowel sounds are normal. There is no distension.     Palpations: Abdomen is soft.      Tenderness: There is abdominal tenderness in the epigastric area. There is no right CVA tenderness, left CVA tenderness, guarding or rebound.  Musculoskeletal:     Cervical back: Normal range of motion.  Lymphadenopathy:     Cervical: No cervical adenopathy.  Skin:    General: Skin is warm and dry.     Findings: No rash.  Neurological:     Mental Status: She is alert and oriented to person, place, and time.  Psychiatric:  Attention and Perception: Attention normal.        Mood and Affect: Mood normal.        Behavior: Behavior normal.    Wt Readings from Last 3 Encounters:  05/28/21 239 lb (108.4 kg)  01/16/21 234 lb 2 oz (106.2 kg)  11/19/20 231 lb (104.8 kg)    BP 126/82   Pulse 75   Temp 98.3 F (36.8 C) (Oral)   Ht 5\' 4"  (1.626 m)   Wt 239 lb (108.4 kg)   LMP 11/12/2017   SpO2 98%   BMI 41.02 kg/m   Assessment and Plan: 1. Mild ankle edema Pt has hx of internal varicose veins; having more ankle swelling and discomfort now that she is working full time - Ambulatory referral to Vascular Surgery - CBC with Differential/Platelet - Comprehensive metabolic panel  2. Epigastric pain Benign exam - continue tagamet daily Screening labs - H. pylori breath test - TSH + free T4 - Amylase - Lipase  3. Colon cancer screening Pt unable to do colonoscopy due to lack of transportation - Cologuard  4. Menopausal hot flushes Trial of Estroven or Black Cohosh   Partially 14/12/2016. Any errors are unintentional.  Contractor, MD [facility]  05/28/2021

## 2021-05-29 ENCOUNTER — Other Ambulatory Visit: Payer: Self-pay

## 2021-05-29 ENCOUNTER — Telehealth: Payer: Self-pay

## 2021-05-29 ENCOUNTER — Other Ambulatory Visit: Payer: Self-pay | Admitting: Internal Medicine

## 2021-05-29 DIAGNOSIS — F5101 Primary insomnia: Secondary | ICD-10-CM

## 2021-05-29 DIAGNOSIS — B009 Herpesviral infection, unspecified: Secondary | ICD-10-CM

## 2021-05-29 DIAGNOSIS — M62830 Muscle spasm of back: Secondary | ICD-10-CM

## 2021-05-29 DIAGNOSIS — K219 Gastro-esophageal reflux disease without esophagitis: Secondary | ICD-10-CM

## 2021-05-29 DIAGNOSIS — R1012 Left upper quadrant pain: Secondary | ICD-10-CM

## 2021-05-29 DIAGNOSIS — F411 Generalized anxiety disorder: Secondary | ICD-10-CM

## 2021-05-29 LAB — CBC WITH DIFFERENTIAL/PLATELET
Basophils Absolute: 0 10*3/uL (ref 0.0–0.2)
Basos: 1 %
EOS (ABSOLUTE): 0.2 10*3/uL (ref 0.0–0.4)
Eos: 3 %
Hematocrit: 39.8 % (ref 34.0–46.6)
Hemoglobin: 13.5 g/dL (ref 11.1–15.9)
Immature Grans (Abs): 0 10*3/uL (ref 0.0–0.1)
Immature Granulocytes: 0 %
Lymphocytes Absolute: 1.8 10*3/uL (ref 0.7–3.1)
Lymphs: 34 %
MCH: 28.5 pg (ref 26.6–33.0)
MCHC: 33.9 g/dL (ref 31.5–35.7)
MCV: 84 fL (ref 79–97)
Monocytes Absolute: 0.4 10*3/uL (ref 0.1–0.9)
Monocytes: 7 %
Neutrophils Absolute: 2.9 10*3/uL (ref 1.4–7.0)
Neutrophils: 55 %
Platelets: 302 10*3/uL (ref 150–450)
RBC: 4.73 x10E6/uL (ref 3.77–5.28)
RDW: 13 % (ref 11.7–15.4)
WBC: 5.3 10*3/uL (ref 3.4–10.8)

## 2021-05-29 LAB — LIPASE: Lipase: 36 U/L (ref 14–72)

## 2021-05-29 LAB — H. PYLORI BREATH TEST: H pylori Breath Test: NEGATIVE

## 2021-05-29 LAB — COMPREHENSIVE METABOLIC PANEL
ALT: 59 IU/L — ABNORMAL HIGH (ref 0–32)
AST: 44 IU/L — ABNORMAL HIGH (ref 0–40)
Albumin/Globulin Ratio: 1.3 (ref 1.2–2.2)
Albumin: 4.5 g/dL (ref 3.8–4.9)
Alkaline Phosphatase: 76 IU/L (ref 44–121)
BUN/Creatinine Ratio: 14 (ref 9–23)
BUN: 12 mg/dL (ref 6–24)
Bilirubin Total: 0.3 mg/dL (ref 0.0–1.2)
CO2: 23 mmol/L (ref 20–29)
Calcium: 9.4 mg/dL (ref 8.7–10.2)
Chloride: 101 mmol/L (ref 96–106)
Creatinine, Ser: 0.87 mg/dL (ref 0.57–1.00)
Globulin, Total: 3.4 g/dL (ref 1.5–4.5)
Glucose: 98 mg/dL (ref 65–99)
Potassium: 4 mmol/L (ref 3.5–5.2)
Sodium: 140 mmol/L (ref 134–144)
Total Protein: 7.9 g/dL (ref 6.0–8.5)
eGFR: 79 mL/min/{1.73_m2} (ref >59)

## 2021-05-29 LAB — TSH+FREE T4
Free T4: 1.13 ng/dL (ref 0.82–1.77)
TSH: 2.56 u[IU]/mL (ref 0.450–4.500)

## 2021-05-29 LAB — AMYLASE: Amylase: 84 U/L (ref 31–110)

## 2021-05-29 MED ORDER — CYCLOBENZAPRINE HCL 10 MG PO TABS: 10.0000 mg | ORAL_TABLET | Freq: Three times a day (TID) | ORAL | 0 refills | Status: AC | PRN

## 2021-05-29 MED ORDER — LINACLOTIDE 145 MCG PO CAPS: ORAL_CAPSULE | ORAL | 0 refills | Status: DC

## 2021-05-29 MED ORDER — TEMAZEPAM 15 MG PO CAPS: 15.0000 mg | ORAL_CAPSULE | Freq: Every evening | ORAL | 5 refills | Status: AC | PRN

## 2021-05-29 MED ORDER — CIMETIDINE 400 MG PO TABS: 400.0000 mg | ORAL_TABLET | Freq: Two times a day (BID) | ORAL | 0 refills | Status: AC

## 2021-05-29 MED ORDER — VALACYCLOVIR HCL 1 G PO TABS: 1000.0000 mg | ORAL_TABLET | Freq: Every day | ORAL | 0 refills | Status: AC

## 2021-05-29 MED ORDER — BUPROPION HCL ER (XL) 150 MG PO TB24: 1.0000 | ORAL_TABLET | Freq: Every day | ORAL | 0 refills | Status: DC

## 2021-05-29 NOTE — Progress Notes (Signed)
Spoke with patient on the phone for 15 mins. Addressed her concerns with swelling of ankles. Told her per her OV note yesterday Dr. Judithann Graves wants her to see vascular surgery for evaluation.   Sent in 90 days refills as requested since she was seen yesterday.

## 2021-05-29 NOTE — Telephone Encounter (Signed)
Patient requesting RF on temazepam.  Please review.  Mariel Sleet, CMA (AAMA)

## 2021-05-29 NOTE — Telephone Encounter (Signed)
Patient informed. Sent my chart message with names so she knows what to try.

## 2021-05-31 IMAGING — MG DIGITAL SCREENING BILAT W/ TOMO W/ CAD
6 of 10 series · 6 of 30 positions shown · non-contrast
Comparison: Previous exam(s).

CLINICAL DATA: Screening.

EXAM:
DIGITAL SCREENING BILATERAL MAMMOGRAM WITH TOMO AND CAD

[R CC synth-2D (1 of 2)]
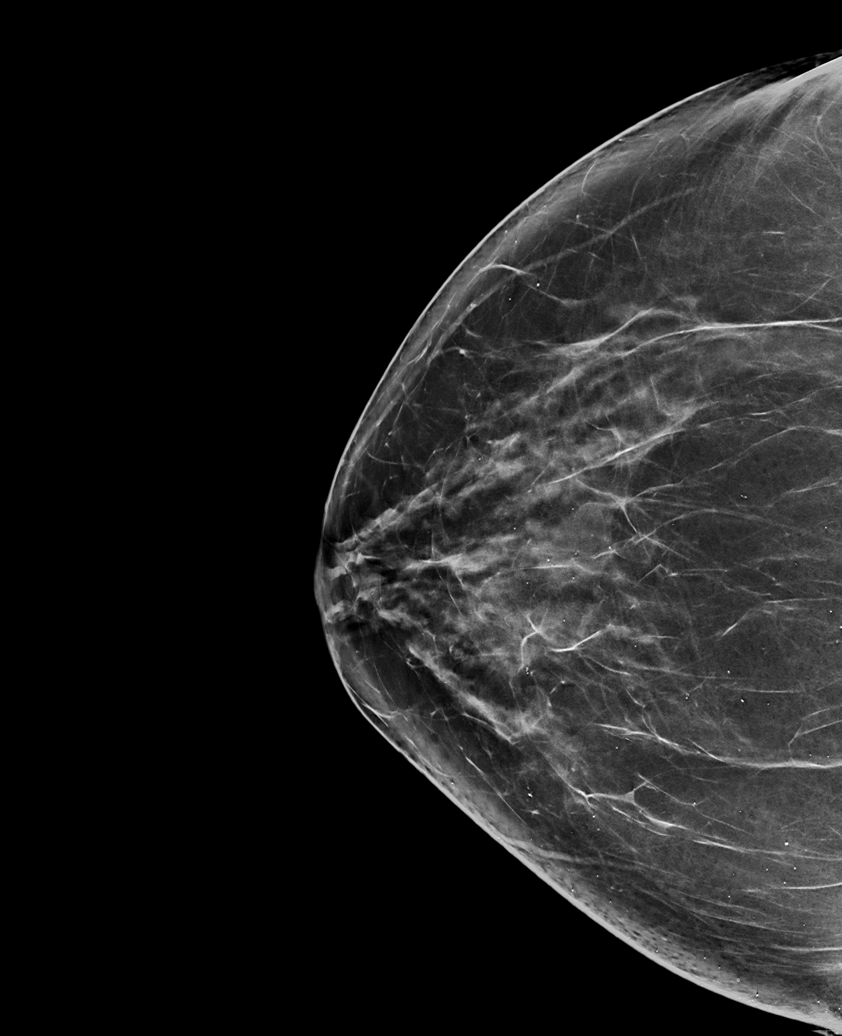

[L MLO synth-2D]
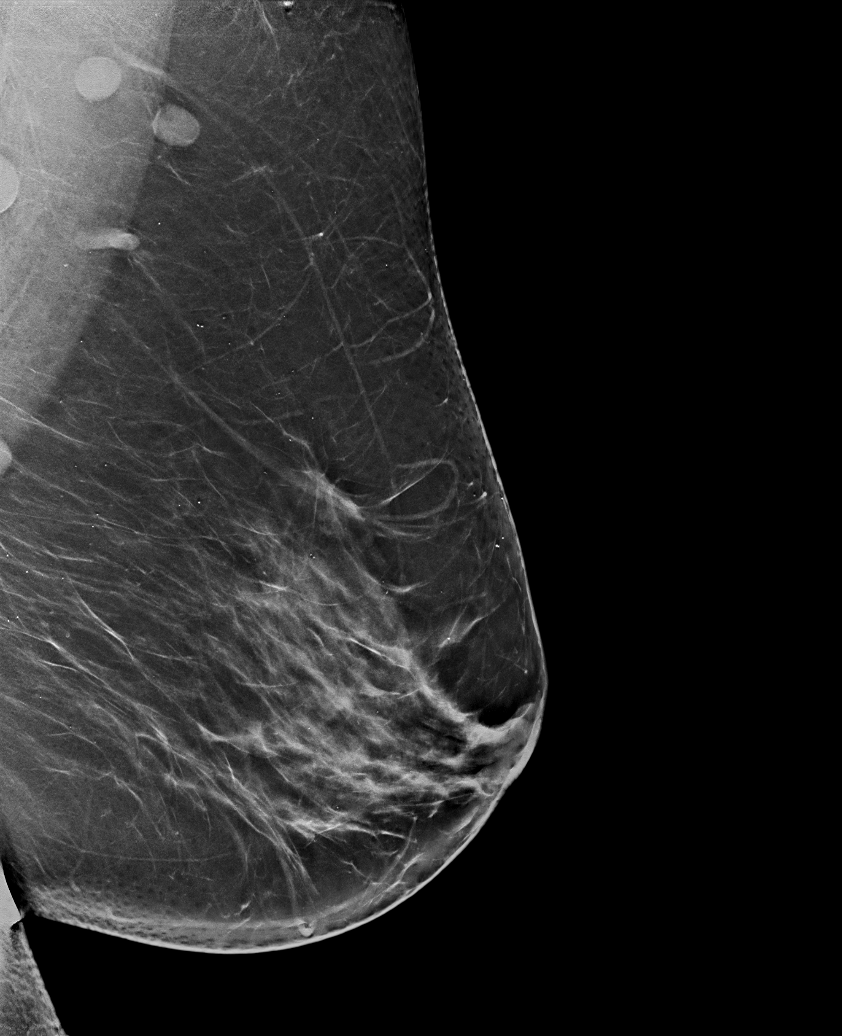

[L CC synth-2D]
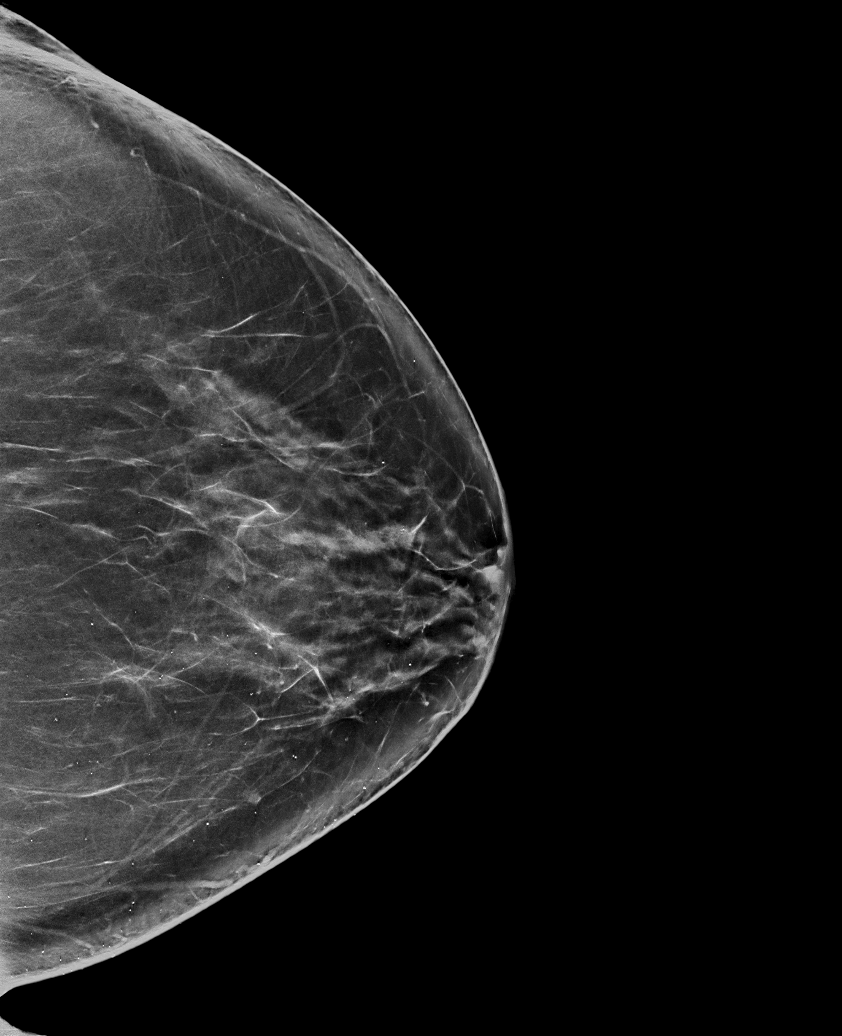

[R CC synth-2D (2 of 2)]
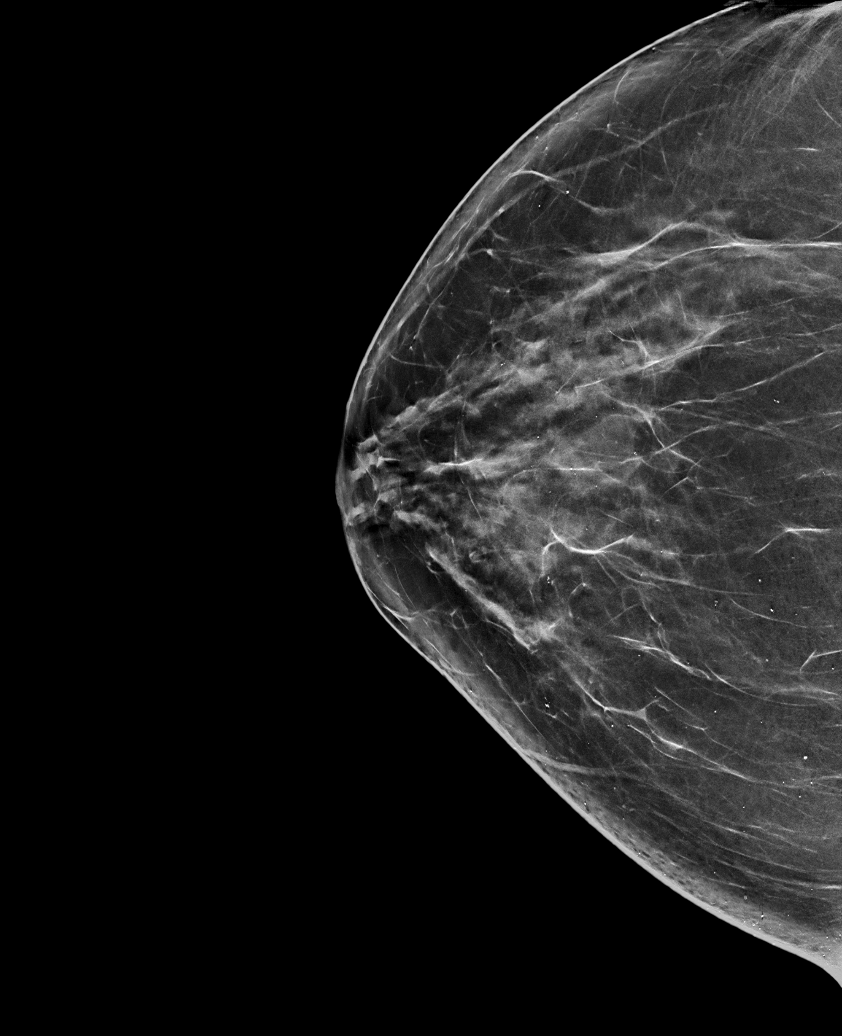

[R MLO synth-2D]
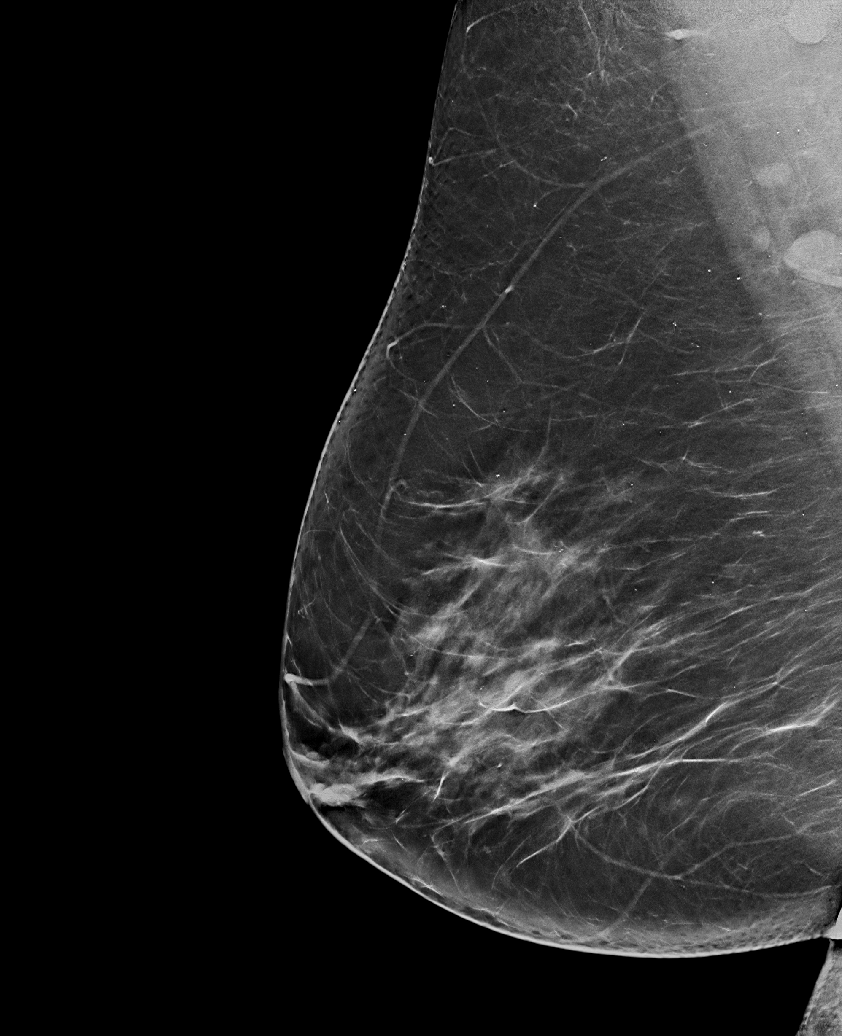

[L CC tomo · tomo slice 44/87.0]
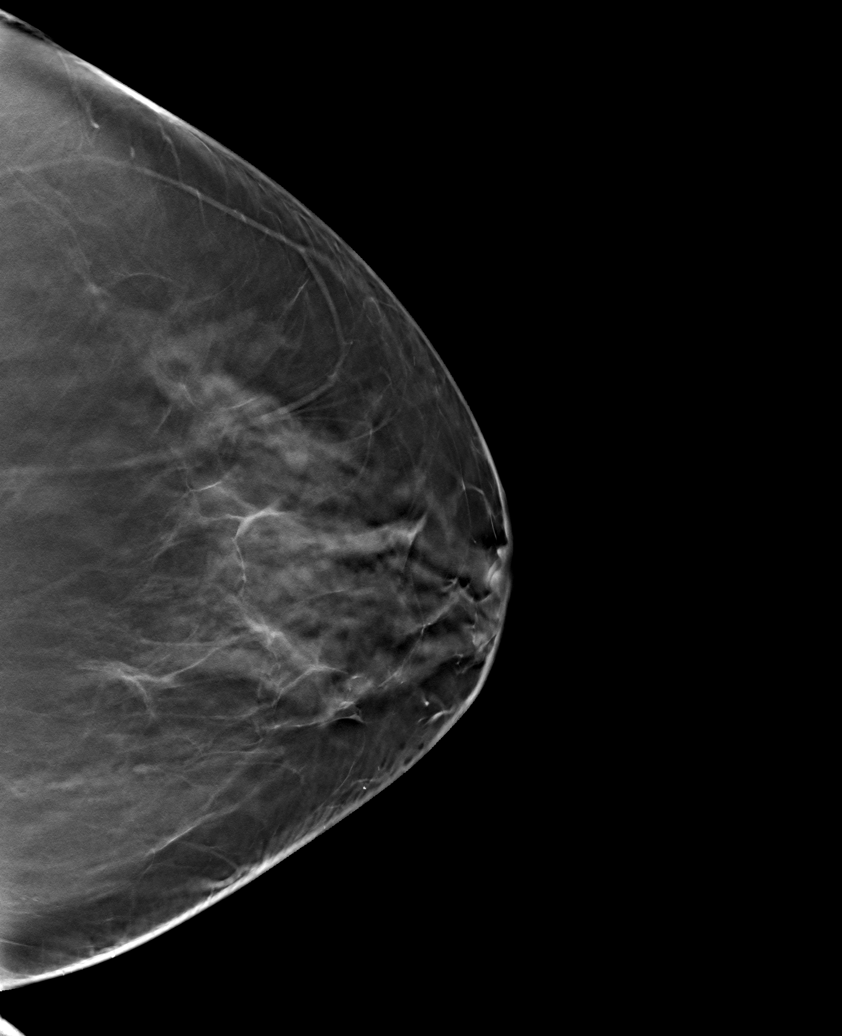

[6 of 30 positions shown; findings below may reference images not displayed]

ACR Breast Density Category c: The breast tissue is heterogeneously
dense, which may obscure small masses.
FINDINGS: There are no findings suspicious for malignancy. Images were
processed with CAD.
IMPRESSION: No mammographic evidence of malignancy. A result letter of this
screening mammogram will be mailed directly to the patient.

RECOMMENDATION:
Screening mammogram in one year. (Code:FT-U-LHB)

BI-RADS CATEGORY  1: Negative.

## 2021-06-04 NOTE — Progress Notes (Signed)
[facility] [place], [place] [address]  Pulmonary Sleep Medicine   Office Visit Note  Patient Name: Marie Whitney DOB: 10-26-1966 MRN [id number]    Chief Complaint: Obstructive Sleep Apnea visit  Brief History:  Joyell is seen today for an initial consult to discuss sleep study results.  Patient reports much difficulty initiating sleep. It will take her hours to fall asleep. Patient reports waking 4-5 times a night and takes a long time to go back to sleep. Patient reports loud snoring, gasping and choking at night. Patient reports chest pains and discomfort occasionally. She reports morning headaches.  Sometimes she wakes with a panic attack. She admits to anxiety and racing mind at nighttime. She does use temazepam as needed at night for sleep which is fairly effective.   General: (-) fever, (-) chills, (-) night sweat Nose and Sinuses: (-) nasal stuffiness or itchiness, (-) postnasal drip, (-) nosebleeds, (-) sinus trouble. Mouth and Throat: (-) sore throat, (-) hoarseness. Neck: (-) swollen glands, (-) enlarged thyroid, (-) neck pain. Respiratory: - cough, - shortness of breath, - wheezing. Neurologic: -- numbness, - tingling. Psychiatric: + anxiety, - depression   Current Medication: Outpatient Encounter Medications as of 06/05/2021  Medication Sig   albuterol (PROVENTIL HFA;VENTOLIN HFA) 108 (90 Base) MCG/ACT inhaler Inhale 1-2 puffs into the lungs every 6 (six) hours as needed for wheezing or shortness of breath.   buPROPion (WELLBUTRIN XL) 150 MG 24 hr tablet Take 1 tablet (150 mg total) by mouth daily.   cimetidine (TAGAMET) 400 MG tablet Take 1 tablet (400 mg total) by mouth 2 (two) times daily.   CLENPIQ 10-3.5-12 MG-GM -GM/160ML SOLN TAKE 320 MLS BY MOUTH ONCE FOR 1 DOSE.   cyclobenzaprine (FLEXERIL) 10 MG tablet Take 1 tablet (10 mg total) by mouth 3 (three) times daily as needed for muscle spasms.   doxycycline (VIBRAMYCIN) 100 MG  capsule TAKE ONE CAPSULE BY MOUTH TWICE A DAY. TAKE WITH LOTS OF WATER AND A FULL MEAL.   fexofenadine (ALLEGRA ALLERGY) 180 MG tablet Take 1 tablet (180 mg total) by mouth daily.   finasteride (PROSCAR) 5 MG tablet Take 5 mg by mouth daily.   fluticasone (FLONASE) 50 MCG/ACT nasal spray Place 2 sprays into both nostrils daily.   hyoscyamine (LEVSIN SL) 0.125 MG SL tablet PLACE 1 TAB ON THE TONGUE TO DISSOLVE, EVERY 6 HOURS AS NEEDED FOR SPASMS.   levalbuterol (XOPENEX HFA) 45 MCG/ACT inhaler SMARTSIG:2 Puff(s) Via Inhaler Every 8 Hours PRN   linaclotide (LINZESS) 145 MCG CAPS capsule TAKE 1 CAPSULE BY MOUTH DAILY BEFORE BREAKFAST.   polyethylene glycol-electrolytes (NULYTELY) 420 g solution TAKE 4,000 ML'S BY MOUTH ONCE FOR 1 DOSE   Tazarotene (ARAZLO) 0.045 % LOTN Apply 1 application topically daily.   temazepam (RESTORIL) 15 MG capsule Take 1 capsule (15 mg total) by mouth at bedtime as needed for sleep.   valACYclovir (VALTREX) 1000 MG tablet Take 1 tablet (1,000 mg total) by mouth daily.   Vitamin D, Ergocalciferol, (DRISDOL) 1.25 MG (50000 UNIT) CAPS capsule Take 50,000 Units by mouth once a week.   [DISCONTINUED] phentermine 37.5 MG capsule Take 1 capsule (37.5 mg total) by mouth every morning.   No facility-administered encounter medications on file as of 06/05/2021.    Surgical History: Past Surgical History:  Procedure Laterality Date   none      Medical History: History reviewed. No pertinent past medical history.  Family History: Non contributory to the present illness  Social History: Social History   Socioeconomic History   Marital status: Legally Separated    Spouse name: Not on file   Number of children: Not on file   Years of education: Not on file   Highest education level: Not on file  Occupational History   Not on file  Tobacco Use   Smoking status: Never   Smokeless tobacco: Never  Substance and Sexual Activity   Alcohol use: No    Alcohol/week: 2.0  standard drinks    Types: 2 Standard drinks or equivalent per week   Drug use: Not on file   Sexual activity: Not on file  Other Topics Concern   Not on file  Social History Narrative   Not on file   Social Determinants of Health   Financial Resource Strain: Not on file  Food Insecurity: Not on file  Transportation Needs: Not on file  Physical Activity: Not on file  Stress: Not on file  Social Connections: Not on file  Intimate Partner Violence: Not on file    Vital Signs: Height 5' 4" (1.626 m), weight 235 lb (106.6 kg), last menstrual period 11/12/2017.  Examination: General Appearance: The patient is well-developed, well-nourished, and in no distress. Neck Circumference:  Skin: Gross inspection of skin unremarkable. Head: normocephalic, no gross deformities. Eyes: no gross deformities noted. ENT: ears appear grossly normal Neurologic: Alert and oriented. No involuntary movements.    EPWORTH SLEEPINESS SCALE:  Scale:  (0)= no chance of dozing; (1)= slight chance of dozing; (2)= moderate chance of dozing; (3)= high chance of dozing  Chance  Situtation    Sitting and reading: 3    Watching TV: 2    Sitting Inactive in public: 0    As a passenger in car: 0      Lying down to rest: 1    Sitting and talking: 0    Sitting quielty after lunch: 3    In a car, stopped in traffic: 0   TOTAL SCORE:   9 out of 24    SLEEP STUDIES:  PSG 04/01/21  AHI 36.7 SPO2 min 88% CPAP titration 05/27/21  CPAP_0  recommended          LABS: Recent Results (from the past 2160 hour(s))  H. pylori breath test     Status: None   Collection Time: 05/28/21  8:49 AM  Result Value Ref Range   H pylori Breath Test Negative Negative  CBC with Differential/Platelet     Status: None   Collection Time: 05/28/21  8:52 AM  Result Value Ref Range   WBC 5.3 3.4 - 10.8 x10E3/uL   RBC 4.73 3.77 - 5.28 x10E6/uL   Hemoglobin 13.5 11.1 - 15.9 g/dL   Hematocrit 39.8 34.0 -  46.6 %   MCV 84 79 - 97 fL   MCH 28.5 26.6 - 33.0 pg   MCHC 33.9 31.5 - 35.7 g/dL   RDW 13.0 11.7 - 15.4 %   Platelets 302 150 - 450 x10E3/uL   Neutrophils 55 Not Estab. %   Lymphs 34 Not Estab. %   Monocytes 7 Not Estab. %   Eos 3 Not Estab. %   Basos 1 Not Estab. %   Neutrophils Absolute 2.9 1.4 - 7.0 x10E3/uL   Lymphocytes Absolute 1.8 0.7 - 3.1 x10E3/uL   Monocytes Absolute 0.4 0.1 - 0.9 x10E3/uL   EOS (ABSOLUTE) 0.2 0.0 - 0.4 x10E3/uL   Basophils Absolute 0.0 0.0 - 0.2 x10E3/uL   Immature Granulocytes 0 Not  Estab. %   Immature Grans (Abs) 0.0 0.0 - 0.1 x10E3/uL  TSH + free T4     Status: None   Collection Time: 05/28/21  8:52 AM  Result Value Ref Range   TSH 2.560 0.450 - 4.500 uIU/mL   Free T4 1.13 0.82 - 1.77 ng/dL  Comprehensive metabolic panel     Status: Abnormal   Collection Time: 05/28/21  8:52 AM  Result Value Ref Range   Glucose 98 65 - 99 mg/dL   BUN 12 6 - 24 mg/dL   Creatinine, Ser 0.87 0.57 - 1.00 mg/dL   eGFR 79 >59 mL/min/1.73   BUN/Creatinine Ratio 14 9 - 23   Sodium 140 134 - 144 mmol/L   Potassium 4.0 3.5 - 5.2 mmol/L   Chloride 101 96 - 106 mmol/L   CO2 23 20 - 29 mmol/L   Calcium 9.4 8.7 - 10.2 mg/dL   Total Protein 7.9 6.0 - 8.5 g/dL   Albumin 4.5 3.8 - 4.9 g/dL   Globulin, Total 3.4 1.5 - 4.5 g/dL   Albumin/Globulin Ratio 1.3 1.2 - 2.2   Bilirubin Total 0.3 0.0 - 1.2 mg/dL   Alkaline Phosphatase 76 44 - 121 IU/L   AST 44 (H) 0 - 40 IU/L   ALT 59 (H) 0 - 32 IU/L  Amylase     Status: None   Collection Time: 05/28/21  8:52 AM  Result Value Ref Range   Amylase 84 31 - 110 U/L  Lipase     Status: None   Collection Time: 05/28/21  8:52 AM  Result Value Ref Range   Lipase 36 14 - 72 U/L    Radiology: MM 3D SCREEN BREAST BILATERAL  Result Date: 11/22/2020 CLINICAL DATA:  Screening. EXAM: DIGITAL SCREENING BILATERAL MAMMOGRAM WITH TOMO AND CAD COMPARISON:  Previous exam(s). ACR Breast Density Category c: The breast tissue is heterogeneously  dense, which may obscure small masses. FINDINGS: There are no findings suspicious for malignancy. Images were processed with CAD. IMPRESSION: No mammographic evidence of malignancy. A result letter of this screening mammogram will be mailed directly to the patient. RECOMMENDATION: Screening mammogram in one year. (Code:[id number]-B-01Y) BI-RADS CATEGORY  1: Negative. Electronically Signed   By: Margarette Canada M.D.   On: 11/22/2020 10:32    No results found.  No results found.    Assessment and Plan: Patient Active Problem List   Diagnosis Date Noted   OSA (obstructive sleep apnea) 06/05/2021   Acne vulgaris 11/19/2020   GERD without esophagitis 03/15/2019   Generalized anxiety disorder 01/12/2016   Muscle spasm of back 11/12/2015   Primary insomnia 11/12/2015   Tinnitus of right ear 11/12/2015   Mixed hyperlipidemia 09/28/2015   Constipation due to slow transit 09/28/2015   Herpes simplex disease 09/28/2015   Allergic rhinitis 09/28/2015   Hx of abnormal mammogram 09/28/2015    1. OSA (obstructive sleep apnea) Pt's study shows AHI of 36.7 demonstrating severe OSA. Her desaturation was as low as 88%. Recommend starting CPAP at 12 cm H20, f/u 30d after set up.   2. GERD without esophagitis Stable, mild. Avoid triggers.   3. Insomnia- recommend cognitive behavioral treatment. She does have a significant issue with anxiety. She is having racing mind. Good sleep hygiene discussed.      General Counseling: I have discussed the findings of the evaluation and examination with Verdis Frederickson.  I have also discussed any further diagnostic evaluation thatmay be needed or ordered today. Caran verbalizes understanding of the findings of  todays visit. We also reviewed her medications today and discussed drug interactions and side effects including but not limited excessive drowsiness and altered mental states. We also discussed that there is always a risk not just to her but also people around her. she has been  encouraged to call the office with any questions or concerns that should arise related to todays visit.  No orders of the defined types were placed in this encounter.       I have personally obtained a history, examined the patient, evaluated laboratory and imaging results, formulated the assessment and plan and placed orders.   This patient was seen today by Tressie Ellis, PA-C in collaboration with Dr. Devona Konig.    Allyne Gee, MD [facility] Diplomate ABMS Pulmonary and Critical Care Medicine Sleep medicine

## 2021-06-05 ENCOUNTER — Ambulatory Visit (INDEPENDENT_AMBULATORY_CARE_PROVIDER_SITE_OTHER): Payer: BC Managed Care – PPO | Admitting: Internal Medicine

## 2021-06-05 VITALS — Ht 64.0 in | Wt 235.0 lb

## 2021-06-05 DIAGNOSIS — K219 Gastro-esophageal reflux disease without esophagitis: Secondary | ICD-10-CM | POA: Diagnosis not present

## 2021-06-05 DIAGNOSIS — F5101 Primary insomnia: Secondary | ICD-10-CM | POA: Diagnosis not present

## 2021-06-05 DIAGNOSIS — G4733 Obstructive sleep apnea (adult) (pediatric): Secondary | ICD-10-CM

## 2021-06-05 NOTE — Patient Instructions (Signed)

## 2021-06-08 ENCOUNTER — Ambulatory Visit: Payer: BC Managed Care – PPO | Admitting: Internal Medicine

## 2021-06-09 LAB — COLOGUARD: Cologuard: NEGATIVE

## 2021-06-18 NOTE — Progress Notes (Signed)
Called pt with negative cologuard. Pt verbalized understanding.  KP 

## 2021-06-22 ENCOUNTER — Ambulatory Visit (INDEPENDENT_AMBULATORY_CARE_PROVIDER_SITE_OTHER): Payer: BC Managed Care – PPO | Admitting: Vascular Surgery

## 2021-06-22 ENCOUNTER — Encounter (INDEPENDENT_AMBULATORY_CARE_PROVIDER_SITE_OTHER): Payer: Self-pay

## 2021-06-22 ENCOUNTER — Ambulatory Visit
Admission: RE | Admit: 2021-06-22 | Discharge: 2021-06-22 | Disposition: A | Discharge status: Home / Self Care | Payer: BC Managed Care – PPO | Source: Ambulatory Visit | Attending: Internal Medicine | Admitting: Internal Medicine

## 2021-06-22 ENCOUNTER — Other Ambulatory Visit: Payer: Self-pay

## 2021-06-22 VITALS — BP 125/83 | HR 81 | Ht 64.0 in | Wt 241.0 lb

## 2021-06-22 DIAGNOSIS — R0989 Other specified symptoms and signs involving the circulatory and respiratory systems: Secondary | ICD-10-CM | POA: Diagnosis not present

## 2021-06-22 DIAGNOSIS — K219 Gastro-esophageal reflux disease without esophagitis: Secondary | ICD-10-CM | POA: Diagnosis not present

## 2021-06-22 DIAGNOSIS — I890 Lymphedema, not elsewhere classified: Secondary | ICD-10-CM | POA: Diagnosis not present

## 2021-06-22 DIAGNOSIS — R1012 Left upper quadrant pain: Secondary | ICD-10-CM | POA: Insufficient documentation

## 2021-06-22 DIAGNOSIS — E782 Mixed hyperlipidemia: Secondary | ICD-10-CM | POA: Diagnosis not present

## 2021-06-22 NOTE — Progress Notes (Signed)
MRN : [id number]  Marie Whitney is a 55 y.o. (February 03, 1966) female who presents with chief complaint of No chief complaint on file. Marland Kitchen  History of Present Illness:   Patient is seen for evaluation of leg swelling. The patient first noticed the swelling remotely but is now concerned because of an increase in the overall edema. The swelling is associated with mild discoloration. The patient notes that in the morning the legs are significantly improved but they steadily worsened throughout the course of the day. Elevation makes the legs better, dependency makes them much worse.   There is no history of ulcerations associated with the swelling.   The patient denies any recent changes in their medications.  The patient has not been wearing graduated compression.  The patient has no had any past angiography, interventions or vascular surgery.  The patient denies a history of DVT or PE. There is no prior history of phlebitis. There is no history of primary lymphedema.  There is no history of radiation treatment to the groin or pelvis No history of malignancies. No history of trauma or groin or pelvic surgery. No history of foreign travel or parasitic infections area    No outpatient medications have been marked as taking for the 06/22/21 encounter (Appointment) with Gilda Crease, Latina Craver, MD.    No past medical history on file.  Past Surgical History:  Procedure Laterality Date   none      Social History Social History   Tobacco Use   Smoking status: Never   Smokeless tobacco: Never  Substance Use Topics   Alcohol use: No    Alcohol/week: 2.0 standard drinks    Types: 2 Standard drinks or equivalent per week    Family History Family History  Problem Relation Age of Onset   CAD Mother    Breast cancer Neg Hx   No family history of bleeding/clotting disorders, porphyria or autoimmune disease   No Known Allergies   REVIEW OF SYSTEMS (Negative unless  checked)  Constitutional: [] Weight loss  [] Fever  [] Chills Cardiac: [] Chest pain   [] Chest pressure   [] Palpitations   [] Shortness of breath when laying flat   [] Shortness of breath with exertion. Vascular:  [] Pain in legs with walking   [x] Pain in legs at rest  [] History of DVT   [] Phlebitis   [x] Swelling in legs   [] Varicose veins   [] Non-healing ulcers Pulmonary:   [] Uses home oxygen   [] Productive cough   [] Hemoptysis   [] Wheeze  [] COPD   [] Asthma Neurologic:  [] Dizziness   [] Seizures   [] History of stroke   [] History of TIA  [] Aphasia   [] Vissual changes   [] Weakness or numbness in arm   [] Weakness or numbness in leg Musculoskeletal:   [] Joint swelling   [] Joint pain   [] Low back pain Hematologic:  [] Easy bruising  [] Easy bleeding   [] Hypercoagulable state   [] Anemic Gastrointestinal:  [] Diarrhea   [] Vomiting  [x] Gastroesophageal reflux/heartburn   [] Difficulty swallowing. Genitourinary:  [] Chronic kidney disease   [] Difficult urination  [] Frequent urination   [] Blood in urine Skin:  [] Rashes   [] Ulcers  Psychological:  [] History of anxiety   []  History of major depression.  Physical Examination  There were no vitals filed for this visit. There is no height or weight on file to calculate BMI. Gen: WD/WN, NAD Head: [place]/AT, No temporalis wasting.  Ear/Nose/Throat: Hearing grossly intact, nares w/o erythema or drainage, poor dentition Eyes: PER, EOMI, sclera nonicteric.  Neck: Supple, no masses.  No bruit or JVD.  Pulmonary:  Good air movement, clear to auscultation bilaterally, no use of accessory muscles.  Cardiac: RRR, normal S1, S2, no Murmurs. Vascular: scattered varicosities present bilaterally.  Mild venous stasis changes to the legs bilaterally.  2+ soft pitting edema.  Bilatreral carotid bruits  Vessel Right Left  Radial Palpable Palpable  PT Palpable Palpable  DP Palpable Palpable  Gastrointestinal: soft, non-distended. No guarding/no peritoneal signs.  Musculoskeletal: M/S  5/5 throughout.  No deformity or atrophy.  Neurologic: CN 2-12 intact. Pain and light touch intact in extremities.  Symmetrical.  Speech is fluent. Motor exam as listed above. Psychiatric: Judgment intact, Mood & affect appropriate for pt's clinical situation. Dermatologic: No rashes or ulcers noted.  No changes consistent with cellulitis. Lymph : No lichenification or skin changes of chronic lymphedema.  CBC Lab Results  Component Value Date   WBC 5.3 05/28/2021   HGB 13.5 05/28/2021   HCT 39.8 05/28/2021   MCV 84 05/28/2021   PLT 302 05/28/2021    BMET    Component Value Date/Time   NA 140 05/28/2021 0852   K 4.0 05/28/2021 0852   CL 101 05/28/2021 0852   CO2 23 05/28/2021 0852   GLUCOSE 98 05/28/2021 0852   GLUCOSE 138 (H) 12/13/2015 0740   BUN 12 05/28/2021 0852   CREATININE 0.87 05/28/2021 0852   CALCIUM 9.4 05/28/2021 0852   GFRNONAA 78 11/13/2020 1124   GFRAA 90 11/13/2020 1124   CrCl cannot be calculated (Patient's most recent lab result is older than the maximum 21 days allowed.).  COAG No results found for: INR, PROTIME  Radiology No results found.   Assessment/Plan 1. Lymphedema I have had a long discussion with the patient regarding swelling and why it  causes symptoms.  Patient will begin wearing graduated compression stockings class 1 (20-30 mmHg) on a daily basis a prescription was given. The patient will  beginning wearing the stockings first thing in the morning and removing them in the evening. The patient is instructed specifically not to sleep in the stockings.   In addition, behavioral modification will be initiated.  This will include frequent elevation, use of over the counter pain medications and exercise such as walking.  I have reviewed systemic causes for chronic edema such as liver, kidney and cardiac etiologies.  The patient denies problems with these organ systems.    Consideration for a lymph pump will also be made based upon the  effectiveness of conservative therapy.  This would help to improve the edema control and prevent sequela such as ulcers and infections   Patient should undergo duplex ultrasound of the venous system to ensure that DVT or reflux is not present.  The patient will follow-up with me after the ultrasound.   - VAS [place] LOWER EXTREMITY VENOUS REFLUX; Future  2. Bilateral carotid bruits Recommend:  Given the patient's asymptomatic carotid bruits no invasive testing or surgery at this time.  Duplex ultrasound will be ordered given the carotid bruits  Continue antiplatelet therapy as prescribed Continue management of CAD, HTN and Hyperlipidemia Healthy heart diet,  encouraged exercise at least 4 times per week Follow up in 1 months with duplex ultrasound and physical exam    3. Mixed hyperlipidemia Continue statin as ordered and reviewed, no changes at this time   4. GERD without esophagitis Continue PPI as already ordered, this medication has been reviewed and there are no changes at this time.  Avoidence of caffeine and alcohol  Moderate  elevation of the head of the bed     Levora Dredge, MD  06/22/2021 12:25 PM

## 2021-06-24 ENCOUNTER — Encounter (INDEPENDENT_AMBULATORY_CARE_PROVIDER_SITE_OTHER): Payer: Self-pay | Admitting: Vascular Surgery

## 2021-06-24 DIAGNOSIS — R0989 Other specified symptoms and signs involving the circulatory and respiratory systems: Secondary | ICD-10-CM | POA: Insufficient documentation

## 2021-06-25 ENCOUNTER — Telehealth: Payer: Self-pay

## 2021-06-25 ENCOUNTER — Other Ambulatory Visit: Payer: Self-pay

## 2021-06-25 ENCOUNTER — Other Ambulatory Visit: Payer: Self-pay | Admitting: Internal Medicine

## 2021-06-25 DIAGNOSIS — K8020 Calculus of gallbladder without cholecystitis without obstruction: Secondary | ICD-10-CM

## 2021-06-25 NOTE — Telephone Encounter (Signed)
Called pt left Vm that she does not need to come to her visit tomorrow. Pt has refills on medications and was seen last month. Pt also need to see general surgery to discuss he gallstones.  [facility] nurse may give results to patient if they return call to clinic, a CRM has been created.  KP

## 2021-06-25 NOTE — Telephone Encounter (Signed)
Patient called and given note below from Dr. Debby Bud. She verbalized understanding and asks for a copy of the ultrasound report to be mailed to her home address.

## 2021-06-26 ENCOUNTER — Ambulatory Visit: Payer: BC Managed Care – PPO | Admitting: Internal Medicine

## 2021-06-26 NOTE — Telephone Encounter (Signed)
Results printed and mailed.  KP

## 2021-07-02 ENCOUNTER — Other Ambulatory Visit: Payer: Self-pay

## 2021-07-02 ENCOUNTER — Ambulatory Visit (INDEPENDENT_AMBULATORY_CARE_PROVIDER_SITE_OTHER): Payer: BC Managed Care – PPO

## 2021-07-02 ENCOUNTER — Encounter (INDEPENDENT_AMBULATORY_CARE_PROVIDER_SITE_OTHER): Payer: Self-pay | Admitting: Nurse Practitioner

## 2021-07-02 ENCOUNTER — Ambulatory Visit (INDEPENDENT_AMBULATORY_CARE_PROVIDER_SITE_OTHER): Payer: BC Managed Care – PPO | Admitting: Nurse Practitioner

## 2021-07-02 ENCOUNTER — Ambulatory Visit: Payer: BC Managed Care – PPO | Admitting: General Surgery

## 2021-07-02 ENCOUNTER — Encounter: Payer: Self-pay | Admitting: General Surgery

## 2021-07-02 VITALS — BP 138/83 | HR 73 | Resp 16

## 2021-07-02 VITALS — BP 125/82 | HR 73 | Temp 98.4°F | Ht 64.0 in | Wt 214.6 lb

## 2021-07-02 DIAGNOSIS — R0989 Other specified symptoms and signs involving the circulatory and respiratory systems: Secondary | ICD-10-CM

## 2021-07-02 DIAGNOSIS — K8020 Calculus of gallbladder without cholecystitis without obstruction: Secondary | ICD-10-CM | POA: Diagnosis not present

## 2021-07-02 DIAGNOSIS — Z8249 Family history of ischemic heart disease and other diseases of the circulatory system: Secondary | ICD-10-CM | POA: Diagnosis not present

## 2021-07-02 DIAGNOSIS — M940 Chondrocostal junction syndrome [Tietze]: Secondary | ICD-10-CM

## 2021-07-02 DIAGNOSIS — I890 Lymphedema, not elsewhere classified: Secondary | ICD-10-CM | POA: Diagnosis not present

## 2021-07-02 NOTE — Patient Instructions (Addendum)
Take 3-200 mg Ibuprofen every six hours. You may also take Tylenol. Continue using heating pad.  Gallbladder Eating Plan If you have a gallbladder condition, you may have trouble digesting fats. Eating a low-fat diet can help reduce your symptoms, and may be helpful before and after having surgery to remove your gallbladder (cholecystectomy). Your health care provider may recommend that you work with a diet and nutrition specialist (dietitian) to help you reduce the amount of fat in your diet. What are tips for following this plan? General guidelines Limit your fat intake to less than 30% of your total daily calories. If you eat around 1,800 calories each day, this is less than 60 grams (g) of fat per day. Fat is an important part of a healthy diet. Eating a low-fat diet can make it hard to maintain a healthy body weight. Ask your dietitian how much fat, calories, and other nutrients you need each day. Eat small, frequent meals throughout the day instead of three large meals. Drink at least 8-10 cups of fluid a day. Drink enough fluid to keep your urine clear or pale yellow. Limit alcohol intake to no more than 1 drink a day for nonpregnant women and 2 drinks a day for men. One drink equals 12 oz of beer, 5 oz of wine, or 1 oz of hard liquor. Reading food labels  Check Nutrition Facts on food labels for the amount of fat per serving. Choose foods with less than 3 grams of fat per serving.  Shopping Choose nonfat and low-fat healthy foods. Look for the words "nonfat," "low fat," or "fat free." Avoid buying processed or prepackaged foods. Cooking Cook using low-fat methods, such as baking, broiling, grilling, or boiling. Cook with small amounts of healthy fats, such as olive oil, grapeseed oil, canola oil, or sunflower oil. What foods are recommended? All fresh, frozen, or canned fruits and vegetables. Whole grains. Low-fat or non-fat (skim) milk and yogurt. Lean meat, skinless poultry, fish,  eggs, and beans. Low-fat protein supplement powders or drinks. Spices and herbs. What foods are not recommended? High-fat foods. These include baked goods, fast food, fatty cuts of meat, ice cream, french toast, sweet rolls, pizza, cheese bread, foods covered with butter, creamy sauces, or cheese. Fried foods. These include french fries, tempura, battered fish, breaded chicken, fried breads, and sweets. Foods with strong odors. Foods that cause bloating and gas. Summary A low-fat diet can be helpful if you have a gallbladder condition, or before and after gallbladder surgery. Limit your fat intake to less than 30% of your total daily calories. This is about 60 g of fat if you eat 1,800 calories each day. Eat small, frequent meals throughout the day instead of three large meals. This information is not intended to replace advice given to you by your health care provider. Make sure you discuss any questions you have with your healthcare provider. Document Revised: 07/17/2020 Document Reviewed: 07/17/2020 Elsevier Patient Education  2022 [employer]    Cholelithiasis  Cholelithiasis happens when gallstones form in the gallbladder. The gallbladder stores bile. Bile is a fluid that helps digest fats. Bile can harden and form into gallstones. If they cause a blockage, they can cause pain (gallbladder attack). What are the causes? This condition may be caused by: Some blood diseases, such as sickle cell anemia. Too much of a fat-like substance (cholesterol) in your bile. Not enough bile salts in your bile. These salts help the body absorb and digest fats. The gallbladder not emptying  fully or often enough. This is common in pregnant women. What increases the risk? The following factors may make you more likely to develop this condition: Being female. Being pregnant many times. Eating a lot of fried foods, fat, and refined carbs (refined carbohydrates). Being very overweight (obese). Being  older than age 31. Using medicines with female hormones in them for a long time. Losing weight fast. Having gallstones in your family. Having some health problems, such as diabetes, Crohn's disease, or liver disease. What are the signs or symptoms? Often, there may be gallstones but no symptoms. These gallstones are called silent gallstones. If a gallstone causes a blockage, you may get sudden pain. The pain: Can be in the upper right part of your belly (abdomen). Normally comes at night or after you eat. Can last an hour or more. Can spread to your right shoulder, back, or chest. Can feel like discomfort, burning, or fullness in the upper part of your belly (indigestion). If the blockage lasts more than a few hours, you can get an infection or swelling. You may: Feel like you may vomit. Vomit. Feel bloated. Have belly pain for 5 hours or more. Feel tender in your belly, often in the upper right part and under your ribs. Have fever or chills. Have skin or the white parts of your eyes turn yellow (jaundice). Have dark pee (urine) or pale poop (stool). How is this treated? Treatment for this condition depends on how bad you feel. If you have symptoms, you may need: Home care, if symptoms are not very bad. Do not eat for 12-24 hours. Drink only water and clear liquids. Start to eat simple or clear foods after 1 or 2 days. Try broths and crackers. You may need medicines for pain or stomach upset or both. If you have an infection, you will need antibiotics. A hospital stay, if you have very bad pain or a very bad infection. Surgery to remove your gallbladder. You may need this if: Gallstones keep coming back. You have very bad symptoms. Medicines to break up gallstones. Medicines: Are best for small gallstones. May be used for up to 6-12 months. A procedure to find and take out gallstones or to break up gallstones. Follow these instructions at home: Medicines Take over-the-counter  and prescription medicines only as told by your doctor. If you were prescribed an antibiotic medicine, take it as told by your doctor. Do not stop taking the antibiotic even if you start to feel better. Ask your doctor if the medicine prescribed to you requires you to avoid driving or using machinery. Eating and drinking Drink enough fluid to keep your urine pale yellow. Drink water or clear fluids. This is important when you have pain. Eat healthy foods. Choose: Fewer fatty foods, such as fried foods. Fewer refined carbs. Avoid breads and grains that are highly processed, such as white bread and white rice. Choose whole grains, such as whole-wheat bread and brown rice. More fiber. Almonds, fresh fruit, and beans are healthy sources. General instructions Keep a healthy weight. Keep all follow-up visits as told by your doctor. This is important. Where to find more information [employer] of Diabetes and Digestive and Kidney Diseases: [employer] Contact a doctor if: You have sudden pain in the upper right part of your belly. Pain might spread to your right shoulder, back, or chest. You have been diagnosed with gallstones that have no symptoms and you get: Belly pain. Discomfort, burning, or fullness in the upper part of  your abdomen. You have dark urine or pale stools. Get help right away if: You have sudden pain in the upper right part of your abdomen, and the pain lasts more than 2 hours. You have pain in your abdomen, and: It lasts more than 5 hours. It keeps getting worse. You have a fever or chills. You keep feeling like you may vomit. You keep vomiting. Your skin or the white parts of your eyes turn yellow. Summary Cholelithiasis happens when gallstones form in the gallbladder. This condition may be caused by a blood disease, too much of a fat-like substance in the bile, or not enough bile salts in bile. Treatment for this condition depends on how bad you feel. If  you have symptoms, do not eat or drink. You may need medicines. You may need a hospital stay for very bad pain or a very bad infection. You may need surgery if gallstones keep coming back or if you have very bad symptoms. This information is not intended to replace advice given to you by your health care provider. Make sure you discuss any questions you have with your healthcare provider. Document Revised: 01/18/2020 Document Reviewed: 10/22/2019 Elsevier Patient Education  2022 [employer].

## 2021-07-02 NOTE — Progress Notes (Signed)
Patient ID: Marie Whitney, female   DOB: 10/26/1966, 55 y.o.   MRN: [id number]  Chief Complaint  Patient presents with   New Patient (Initial Visit)    gallbladder    HPI Marie Whitney is a 55 y.o. female.   She has been referred by her primary care provider, Dr. Berglund, for further evaluation of cholelithiasis.  Marie Whitney presented to Dr. Berglund in June of this year with a roughly 2-month history of left upper quadrant pain.  The pain is worse when she lies on that side or if there is pressure applied to her rib cage.  The pain is described as mostly dull and achy and does not seem to radiate anywhere.  The patient is unable to describe any exacerbating or alleviating factors aside from application of a heating pad, which she finds does soothe the discomfort.  In an effort to try to delineate the source of this pain, an abdominal ultrasound was performed.  This study included the spleen pancreas liver and gallbladder.  Aside from fatty liver, the only other abnormality appreciated was cholelithiasis.  There is no evidence of biliary ductal dilatation or obstruction, no gallbladder wall thickening, nor any pericholecystic fluid.  Upon further questioning, the patient states that she never has had pain in her right upper quadrant.  She denies any nausea or vomiting.  No bloating or belching.  No diarrhea or constipation.  She has never had jaundice or pancreatitis.  There is no association of her discomfort with eating.  She  wonders if her obesity is leading to pressure on her rib cage.   Past Medical History:  Diagnosis Date   Allergy    Anxiety    GERD (gastroesophageal reflux disease)    Sleep apnea     Past Surgical History:  Procedure Laterality Date   none      Family History  Problem Relation Age of Onset   CAD Mother    Breast cancer Neg Hx     Social History Social History   Tobacco Use   Smoking status: Never   Smokeless tobacco: Never  Substance  Use Topics   Alcohol use: No    Alcohol/week: 2.0 standard drinks    Types: 2 Standard drinks or equivalent per week    No Known Allergies  Current Outpatient Medications  Medication Sig Dispense Refill   buPROPion (WELLBUTRIN XL) 150 MG 24 hr tablet Take 1 tablet (150 mg total) by mouth daily. 90 tablet 0   cimetidine (TAGAMET) 400 MG tablet Take 1 tablet (400 mg total) by mouth 2 (two) times daily. 180 tablet 0   cyclobenzaprine (FLEXERIL) 10 MG tablet Take 1 tablet (10 mg total) by mouth 3 (three) times daily as needed for muscle spasms. 270 tablet 0   fexofenadine (ALLEGRA ALLERGY) 180 MG tablet Take 1 tablet (180 mg total) by mouth daily. 90 tablet 3   finasteride (PROSCAR) 5 MG tablet Take by mouth.     fluticasone (FLONASE) 50 MCG/ACT nasal spray Place 2 sprays into both nostrils daily.     hyoscyamine (LEVSIN SL) 0.125 MG SL tablet PLACE 1 TAB ON THE TONGUE TO DISSOLVE, EVERY 6 HOURS AS NEEDED FOR SPASMS. 90 tablet 0   levalbuterol (XOPENEX HFA) 45 MCG/ACT inhaler SMARTSIG:2 Puff(s) Via Inhaler Every 8 Hours PRN     linaclotide (LINZESS) 145 MCG CAPS capsule TAKE 1 CAPSULE BY MOUTH DAILY BEFORE BREAKFAST. 90 capsule 0   Tazarotene (ARAZLO) 0.045 % LOTN Apply   1 application topically daily.     temazepam (RESTORIL) 15 MG capsule Take 1 capsule (15 mg total) by mouth at bedtime as needed for sleep. 30 capsule 5   valACYclovir (VALTREX) 1000 MG tablet Take 1 tablet (1,000 mg total) by mouth daily. 90 tablet 0   Vitamin D, Ergocalciferol, (DRISDOL) 1.25 MG (50000 UNIT) CAPS capsule Take 50,000 Units by mouth once a week.     No current facility-administered medications for this visit.    Review of Systems Review of Systems  HENT:  Positive for tinnitus.   Cardiovascular:  Positive for leg swelling.  Gastrointestinal:  Positive for abdominal pain.       Left upper quadrant   Blood pressure 125/82, pulse 73, temperature 98.4 F (36.9 C), height 5' 4" (1.626 m), weight 214 lb 9.6  oz (97.3 kg), last menstrual period 11/12/2017, SpO2 97 %. Body mass index is 36.84 kg/m.  Physical Exam Physical Exam Constitutional:      General: She is not in acute distress.    Appearance: She is obese.  HENT:     Head: Normocephalic and atraumatic.     Nose:     Comments: Covered with a mask    Mouth/Throat:     Comments: Covered with a mask Eyes:     General: No scleral icterus.       Right eye: No discharge.        Left eye: No discharge.  Neck:     Comments: The trachea is midline.  No palpable cervical or supraclavicular lymphadenopathy.  No thyromegaly or dominant thyroid masses appreciated.  The gland moves freely with deglutition. Cardiovascular:     Rate and Rhythm: Normal rate and regular rhythm.     Pulses: Normal pulses.  Pulmonary:     Effort: Pulmonary effort is normal.     Breath sounds: Normal breath sounds.  Abdominal:     General: Bowel sounds are normal.     Palpations: Abdomen is soft.     Comments: There is no tenderness to palpation in either the epigastric area or right upper quadrant, even with deep pressure.  Murphy sign is negative.  The pain on her left is reproducible with palpation of her rib cage.  Genitourinary:    Comments: Deferred Musculoskeletal:        General: No swelling or tenderness.  Skin:    General: Skin is warm and dry.  Neurological:     General: No focal deficit present.     Mental Status: She is alert and oriented to person, place, and time.  Psychiatric:        Mood and Affect: Mood normal.        Behavior: Behavior normal.    Data Reviewed Results for Marie Whitney, Marie Whitney (MRN [id number]) as of 07/02/2021 12:39  Ref. Range 05/28/2021 08:52  Sodium Latest Ref Range: 134 - 144 mmol/L 140  Potassium Latest Ref Range: 3.5 - 5.2 mmol/L 4.0  Chloride Latest Ref Range: 96 - 106 mmol/L 101  CO2 Latest Ref Range: 20 - 29 mmol/L 23  Glucose Latest Ref Range: 65 - 99 mg/dL 98  BUN Latest Ref Range: 6 - 24 mg/dL 12   Creatinine Latest Ref Range: 0.57 - 1.00 mg/dL 0.87  Calcium Latest Ref Range: 8.7 - 10.2 mg/dL 9.4  BUN/Creatinine Ratio Latest Ref Range: 9 - 23  14  eGFR Latest Ref Range: >59 mL/min/1.73 79  Alkaline Phosphatase Latest Ref Range: 44 - 121 IU/L 76    Albumin Latest Ref Range: 3.8 - 4.9 g/dL 4.5  Albumin/Globulin Ratio Latest Ref Range: 1.2 - 2.2  1.3  Amylase, Serum Latest Ref Range: 31 - 110 U/L 84  Lipase Latest Ref Range: 14 - 72 U/L 36  AST Latest Ref Range: 0 - 40 IU/L 44 (H)  ALT Latest Ref Range: 0 - 32 IU/L 59 (H)  Total Protein Latest Ref Range: 6.0 - 8.5 g/dL 7.9  Total Bilirubin Latest Ref Range: 0.0 - 1.2 mg/dL 0.3  Globulin, Total Latest Ref Range: 1.5 - 4.5 g/dL 3.4  WBC Latest Ref Range: 3.4 - 10.8 x10E3/uL 5.3  RBC Latest Ref Range: 3.77 - 5.28 x10E6/uL 4.73  Hemoglobin Latest Ref Range: 11.1 - 15.9 g/dL 13.5  HCT Latest Ref Range: 34.0 - 46.6 % 39.8  MCV Latest Ref Range: 79 - 97 fL 84  MCH Latest Ref Range: 26.6 - 33.0 pg 28.5  MCHC Latest Ref Range: 31.5 - 35.7 g/dL 33.9  RDW Latest Ref Range: 11.7 - 15.4 % 13.0  Platelets Latest Ref Range: 150 - 450 x10E3/uL 302  Neutrophils Latest Ref Range: Not Estab. % 55  Immature Granulocytes Latest Ref Range: Not Estab. % 0  NEUT# Latest Ref Range: 1.4 - 7.0 x10E3/uL 2.9  Lymphocyte # Latest Ref Range: 0.7 - 3.1 x10E3/uL 1.8  Monocytes Absolute Latest Ref Range: 0.1 - 0.9 x10E3/uL 0.4  Basophils Absolute Latest Ref Range: 0.0 - 0.2 x10E3/uL 0.0  Immature Grans (Abs) Latest Ref Range: 0.0 - 0.1 x10E3/uL 0.0  Lymphs Latest Ref Range: Not Estab. % 34  Monocytes Latest Ref Range: Not Estab. % 7  Basos Latest Ref Range: Not Estab. % 1  Eos Latest Ref Range: Not Estab. % 3  EOS (ABSOLUTE) Latest Ref Range: 0.0 - 0.4 x10E3/uL 0.2  These labs from May 28, 2021 are essentially within normal limits.  Specifically, there is no evidence of biliary obstruction or gallbladder inflammation.  I think the mild elevations in AST and ALT  are likely explicable by fatty liver disease.  I reviewed 2 different abdominal ultrasound studies, one performed December 13, 2015 and ibw performed June 22, 2021.  The 2016 study was limited to the right upper quadrant.  This did demonstrate cholelithiasis without cholecystitis.  The results of the 2022 study are copied here and I concur with the radiology interpretation:  CLINICAL DATA:  Left upper quadrant abdominal pain over the last several months.   EXAM: ABDOMEN ULTRASOUND COMPLETE   COMPARISON:  12/13/2015   FINDINGS: Gallbladder: The gallbladder appears to be filled with stones. No wall thickening. No Murphy sign. No surrounding fluid.   Common bile duct: Diameter: 2.6 mm.  Normal.   Liver: Hepatomegaly with increased echogenicity suggesting fatty change. No focal lesion or ductal dilatation. Portal vein is patent on color Doppler imaging with normal direction of blood flow towards the liver.   IVC: No abnormality visualized.   Pancreas: Visualized portion unremarkable.   Spleen: Size and appearance within normal limits.   Right Kidney: Length: 12.0 cm. Echogenicity within normal limits. No mass or hydronephrosis visualized.   Left Kidney: Length: 11.3 cm. Few small cysts, no larger than 1.2 cm. No hydronephrosis or evidence of mass.   Abdominal aorta: No aneurysm visualized.   Other findings: None.   IMPRESSION: The gallbladder is filled with gallstones. No sonographic evidence of cholecystitis or obstruction however.   Enlarged echogenic liver consistent with steatosis.   Spleen and pancreatic tail region appear unremarkable.   I also   reviewed her clinic visit with Dr. Schnier, regarding possible venous stasis disease and the PICC potential for carotid artery stenosis.  Apparently the patient's mother had a number of blood clots, though the patient herself has not had these.  She is actually scheduled today for carotid Doppler studies as well as lower  extremity ultrasound imaging.  Assessment This is a 55-year-old woman with left upper quadrant pain, reproducible with chest wall palpation.  She has had 2 ultrasounds that demonstrate cholelithiasis, but her symptoms do not seem to be consistent with biliary colic or symptomatic cholelithiasis.  Plan I had an in-depth discussion with the patient and her friend, Becky, who accompanied her to clinic today.  I explained that many people have gallstones and never go on to develop clinical symptoms.  I discussed the typical symptoms of biliary colic/symptomatic cholelithiasis, none of which the patient endorsed.  I also discussed the possibility that her symptoms may be secondary to costochondritis, particularly in light of the fact that a heating pad seems to help the symptoms.  I have recommended that she take ibuprofen and Tylenol on a scheduled basis for short period of time to see if this helps alleviate the pain.  She should continue to use the heating pad as needed.  There is no surgical intervention necessary at this time.  If she does develop symptoms from her gallstones, I have encouraged her to contact my office for another appointment to discuss surgical intervention.  She was provided with patient education materials regarding gallstones.  I will see her on an as-needed basis.    Jennifer Cannon 07/02/2021, 12:13 PM    

## 2021-07-03 ENCOUNTER — Other Ambulatory Visit (INDEPENDENT_AMBULATORY_CARE_PROVIDER_SITE_OTHER): Payer: Self-pay | Admitting: Nurse Practitioner

## 2021-07-03 DIAGNOSIS — R200 Anesthesia of skin: Secondary | ICD-10-CM

## 2021-07-06 ENCOUNTER — Other Ambulatory Visit: Payer: Self-pay

## 2021-07-06 ENCOUNTER — Ambulatory Visit (INDEPENDENT_AMBULATORY_CARE_PROVIDER_SITE_OTHER): Payer: BC Managed Care – PPO

## 2021-07-06 DIAGNOSIS — R200 Anesthesia of skin: Secondary | ICD-10-CM | POA: Diagnosis not present

## 2021-07-06 DIAGNOSIS — R202 Paresthesia of skin: Secondary | ICD-10-CM | POA: Diagnosis not present

## 2021-07-08 ENCOUNTER — Other Ambulatory Visit: Payer: Self-pay | Admitting: Gastroenterology

## 2021-07-08 DIAGNOSIS — R109 Unspecified abdominal pain: Secondary | ICD-10-CM

## 2021-07-09 ENCOUNTER — Telehealth: Payer: Self-pay

## 2021-07-09 ENCOUNTER — Telehealth (INDEPENDENT_AMBULATORY_CARE_PROVIDER_SITE_OTHER): Payer: Self-pay | Admitting: Nurse Practitioner

## 2021-07-09 ENCOUNTER — Other Ambulatory Visit: Payer: Self-pay | Admitting: Internal Medicine

## 2021-07-09 DIAGNOSIS — R109 Unspecified abdominal pain: Secondary | ICD-10-CM

## 2021-07-09 MED ORDER — HYOSCYAMINE SULFATE 0.125 MG SL SUBL: 0.1250 mg | SUBLINGUAL_TABLET | Freq: Four times a day (QID) | SUBLINGUAL | 0 refills | Status: DC | PRN

## 2021-07-09 NOTE — Telephone Encounter (Signed)
Copied from CRM [id number]. Topic: General - Other >> Jul 08, 2021  3:01 PM Marie Whitney wrote: Reason for CRM: Pt needs to speak with Chassidy asap about a medication (hyoscyamine (LEVSIN SL) 0.125 MG SL tablet ) / Pt also mention colonoscopy being put on hold due to no one able to take her and she will need to be put to sleep during procedure / Pt asked for nurse to call her before the end of day today / or send message through mychart /please advise

## 2021-07-09 NOTE — Telephone Encounter (Signed)
Sent patient a my chart message about her concerns.

## 2021-07-09 NOTE — Telephone Encounter (Signed)
Called to make sure that we did file her insurance for past visits on 07/02/21 & 07/06/21. I made her aware that all appts were attached to her insurance. Patient contacted [facility] billing dept before she called me and they stated that they could not see that insurance was attached. (Made admin aware and she confirmed appts were linked to visit).   This note is for documentation purposes only.

## 2021-07-10 ENCOUNTER — Other Ambulatory Visit: Payer: Self-pay | Admitting: Internal Medicine

## 2021-07-10 ENCOUNTER — Encounter (INDEPENDENT_AMBULATORY_CARE_PROVIDER_SITE_OTHER): Payer: BC Managed Care – PPO

## 2021-07-10 ENCOUNTER — Ambulatory Visit (INDEPENDENT_AMBULATORY_CARE_PROVIDER_SITE_OTHER): Payer: BC Managed Care – PPO | Admitting: Nurse Practitioner

## 2021-07-10 ENCOUNTER — Telehealth: Payer: Self-pay

## 2021-07-10 DIAGNOSIS — R109 Unspecified abdominal pain: Secondary | ICD-10-CM

## 2021-07-10 NOTE — Telephone Encounter (Signed)
Pt called about the message requesting a 90 day supply/ Rx was sent yesterday for (LEVSIN SL) but it was for 90 tablets / not 90 days / pt states its only 23 days supply/ please advise asap  Monday will be too late and insurance will not cover it

## 2021-07-10 NOTE — Telephone Encounter (Signed)
Already signed. Receipt confirmed by pharmacy 7/28/2 at 2:19 pm.

## 2021-07-10 NOTE — Telephone Encounter (Signed)
Copied from CRM [id number]. Topic: General - Other >> Jul 09, 2021  4:51 PM Gaetana Michaelis A wrote: Reason for CRM: Patient would like to be contacted by a member of clinical staff regarding their recently submitted prescription for hyoscyamine (LEVSIN SL) 0.125 MG SL tablet   The patient needs the prescription prescribed with a 90 day supply to optimize the price of the medication with their insurance provider as well as their pharmacy   The patient shares that their pharmacy has attempted to contact the practice as well  The patients insurance will end at the month  Please contact to further advise when possible

## 2021-07-10 NOTE — Telephone Encounter (Signed)
A 90 day supply of medication was sent in 07/09/2021.  KP

## 2021-07-10 NOTE — Telephone Encounter (Signed)
Patient has always received 90 tablets for this because its an as needed medication. Sent patient my chart message informing of this information.

## 2021-07-12 ENCOUNTER — Encounter (INDEPENDENT_AMBULATORY_CARE_PROVIDER_SITE_OTHER): Payer: Self-pay | Admitting: Nurse Practitioner

## 2021-07-12 NOTE — Progress Notes (Signed)
Subjective:    Patient ID: Marie Whitney, female    DOB: 05-22-1966, 55 y.o.   MRN: [id number] Chief Complaint  Patient presents with   Follow-up    Ultrasound follow up    Marie Whitney 55 year old woman that returns today for follow-up evaluation of lower extremity edema.  The patient also had carotid bruits auscultated at her last office visit.  The patient is concerned due to family history of blood clots.  She notes that her mother was on Coumadin and her sister also had clots.  The patient has swelling that gets worse with the pain is better with elevation and activity.  The patient has worn some compression but not consistently.  She denies any TIA-like symptoms or CVA-like symptoms.  Today noninvasive studies show carotid stenosis of 1 to 39% bilaterally.  Bilateral vertebral arteries have antegrade flow with normal flow hemodynamics in the bilateral subclavian arteries.  No evidence of DVT or superficial thrombophlebitis seen in the bilateral lower extremities.  The patient has deep venous insufficiency in the left lower extremity.  There is evidence of superficial reflux in the bilateral great saphenous veins at the saphenofemoral junction.  There is also reflux in the great saphenous vein at the proximal thigh.   Review of Systems  Cardiovascular:  Positive for leg swelling.  All other systems reviewed and are negative.     Objective:   Physical Exam Vitals reviewed.  HENT:     Head: Normocephalic.  Cardiovascular:     Rate and Rhythm: Normal rate.     Pulses: Normal pulses.  Pulmonary:     Effort: Pulmonary effort is normal.  Musculoskeletal:     Right lower leg: Edema present.     Left lower leg: Edema present.  Neurological:     Mental Status: She is alert and oriented to person, place, and time.  Psychiatric:        Mood and Affect: Mood normal.        Behavior: Behavior normal.        Thought Content: Thought content normal.        Judgment:  Judgment normal.    BP 138/83 (BP Location: Right Arm)   Pulse 73   Resp 16   LMP 11/12/2017   Past Medical History:  Diagnosis Date   Allergy    Anxiety    GERD (gastroesophageal reflux disease)    Sleep apnea     Social History   Socioeconomic History   Marital status: Legally Separated    Spouse name: Not on file   Number of children: Not on file   Years of education: Not on file   Highest education level: Not on file  Occupational History   Not on file  Tobacco Use   Smoking status: Never   Smokeless tobacco: Never  Substance and Sexual Activity   Alcohol use: No    Alcohol/week: 2.0 standard drinks    Types: 2 Standard drinks or equivalent per week   Drug use: Not on file   Sexual activity: Not on file  Other Topics Concern   Not on file  Social History Narrative   Not on file   Social Determinants of Health   Financial Resource Strain: Not on file  Food Insecurity: Not on file  Transportation Needs: Not on file  Physical Activity: Not on file  Stress: Not on file  Social Connections: Not on file  Intimate Partner Violence: Not on file  Past Surgical History:  Procedure Laterality Date   none      Family History  Problem Relation Age of Onset   CAD Mother    Breast cancer Neg Hx     No Known Allergies  CBC Latest Ref Rng & Units 05/28/2021 11/13/2020 11/13/2019  WBC 3.4 - 10.8 x10E3/uL 5.3 6.1 5.1  Hemoglobin 11.1 - 15.9 g/dL 16.113.5 09.614.4 04.514.9  Hematocrit 34.0 - 46.6 % 39.8 42.3 43.7  Platelets 150 - 450 x10E3/uL 302 357 404      CMP     Component Value Date/Time   NA 140 05/28/2021 0852   K 4.0 05/28/2021 0852   CL 101 05/28/2021 0852   CO2 23 05/28/2021 0852   GLUCOSE 98 05/28/2021 0852   GLUCOSE 138 (H) 12/13/2015 0740   BUN 12 05/28/2021 0852   CREATININE 0.87 05/28/2021 0852   CALCIUM 9.4 05/28/2021 0852   PROT 7.9 05/28/2021 0852   ALBUMIN 4.5 05/28/2021 0852   AST 44 (H) 05/28/2021 0852   ALT 59 (H) 05/28/2021 0852    ALKPHOS 76 05/28/2021 0852   BILITOT 0.3 05/28/2021 0852   GFRNONAA 78 11/13/2020 1124   GFRAA 90 11/13/2020 1124     VAS US ABI WITH/WO TBI  Result Date: 07/06/2021  LOWER EXTREMITY DOPPLER STUDY Patient Name:  Marie MylarMaria E Pereira Whitney  Date of Exam:   07/06/2021 Medical Rec #: [id number]              Accession #:    [id number] Date of Birth: 05/15/1966              Patient Gender: F Patient Age:   [age]Y Exam Location:  [facility] Procedure:      VAS US ABI WITH/WO TBI Referring Phys: [id number] Erma PintoFALLON E Celene Pippins --------------------------------------------------------------------------------  Indications: Rest pain.  Performing Technologist: Reece AgarValarie Baldwin RT (R)(VS)  Examination Guidelines: A complete evaluation includes at minimum, Doppler waveform signals and systolic blood pressure reading at the level of bilateral brachial, anterior tibial, and posterior tibial arteries, when vessel segments are accessible. Bilateral testing is considered an integral part of a complete examination. Photoelectric Plethysmograph (PPG) waveforms and toe systolic pressure readings are included as required and additional duplex testing as needed. Limited examinations for reoccurring indications may be performed as noted.  ABI Findings: +---------+------------------+-----+---------+--------+ Right    Rt Pressure (mmHg)IndexWaveform Comment  +---------+------------------+-----+---------+--------+ Brachial 149                                      +---------+------------------+-----+---------+--------+ ATA      167               1.11 triphasic         +---------+------------------+-----+---------+--------+ PTA      155               1.03 triphasic         +---------+------------------+-----+---------+--------+ Great Toe119               0.79 Normal            +---------+------------------+-----+---------+--------+ +---------+------------------+-----+---------+-------+ Left     Lt Pressure  (mmHg)IndexWaveform Comment +---------+------------------+-----+---------+-------+ Brachial 151                                     +---------+------------------+-----+---------+-------+ ATA  172               1.14 triphasic        +---------+------------------+-----+---------+-------+ PTA      157               1.04 triphasic        +---------+------------------+-----+---------+-------+ Great Toe173               1.15 Normal           +---------+------------------+-----+---------+-------+ Summary: Right: Resting right ankle-brachial index is within normal range. No evidence of significant right lower extremity arterial disease. The right toe-brachial index is normal. Left: Resting left ankle-brachial index is within normal range. No evidence of significant left lower extremity arterial disease. The left toe-brachial index is normal. *See table(s) above for measurements and observations.  Electronically signed by Levora Dredge MD on 07/06/2021 at 4:35:44 PM.    Final        Assessment & Plan:   1. Lymphedema I have had a long discussion with the patient regarding swelling and why it  causes symptoms.  Patient will begin wearing graduated compression stockings class 1 (20-30 mmHg) on a daily basis a prescription was given. The patient will  beginning wearing the stockings first thing in the morning and removing them in the evening. The patient is instructed specifically not to sleep in the stockings.   In addition, behavioral modification will be initiated.  This will include frequent elevation, use of over the counter pain medications and exercise such as walking.  I have reviewed systemic causes for chronic edema such as liver, kidney and cardiac etiologies.  The patient denies problems with these organ systems.    Consideration for a lymph pump will also be made based upon the effectiveness of conservative therapy.  This would help to improve the edema control and  prevent sequela such as ulcers and infections   Patient return in 3 months for reevaluation. 2. Bilateral carotid bruits Recommend:  Given the patient's asymptomatic subcritical stenosis no further invasive testing or surgery at this time.  Duplex ultrasound shows 1 to 39% stenosis bilaterally.  Continue antiplatelet therapy as prescribed Continue management of CAD, HTN and Hyperlipidemia Healthy heart diet,  encouraged exercise at least 4 times per week Follow up in 12 months with duplex ultrasound and physical exam    3. Family history of DVT The patient is very anxious about the possibility of having a hypercoagulable disorder.  The patient notes that her mother and sister have both had DVTs.  We will refer her to hematology for work-up of a possible hypercoagulable condition. - Ambulatory referral to Hematology / Oncology   Current Outpatient Medications on File Prior to Visit  Medication Sig Dispense Refill   buPROPion (WELLBUTRIN XL) 150 MG 24 hr tablet Take 1 tablet (150 mg total) by mouth daily. 90 tablet 0   cimetidine (TAGAMET) 400 MG tablet Take 1 tablet (400 mg total) by mouth 2 (two) times daily. 180 tablet 0   cyclobenzaprine (FLEXERIL) 10 MG tablet Take 1 tablet (10 mg total) by mouth 3 (three) times daily as needed for muscle spasms. 270 tablet 0   fexofenadine (ALLEGRA ALLERGY) 180 MG tablet Take 1 tablet (180 mg total) by mouth daily. 90 tablet 3   finasteride (PROSCAR) 5 MG tablet Take by mouth.     fluticasone (FLONASE) 50 MCG/ACT nasal spray Place 2 sprays into both nostrils daily.     levalbuterol (XOPENEX HFA) 45 MCG/ACT inhaler  SMARTSIG:2 Puff(s) Via Inhaler Every 8 Hours PRN     linaclotide (LINZESS) 145 MCG CAPS capsule TAKE 1 CAPSULE BY MOUTH DAILY BEFORE BREAKFAST. 90 capsule 0   Tazarotene (ARAZLO) 0.045 % LOTN Apply 1 application topically daily.     temazepam (RESTORIL) 15 MG capsule Take 1 capsule (15 mg total) by mouth at bedtime as needed for sleep. 30  capsule 5   valACYclovir (VALTREX) 1000 MG tablet Take 1 tablet (1,000 mg total) by mouth daily. 90 tablet 0   Vitamin D, Ergocalciferol, (DRISDOL) 1.25 MG (50000 UNIT) CAPS capsule Take 50,000 Units by mouth once a week.     No current facility-administered medications on file prior to visit.    There are no Patient Instructions on file for this visit. No follow-ups on file.   Georgiana Spinner, NP

## 2021-07-20 ENCOUNTER — Telehealth (INDEPENDENT_AMBULATORY_CARE_PROVIDER_SITE_OTHER): Payer: Self-pay | Admitting: Nurse Practitioner

## 2021-07-20 ENCOUNTER — Other Ambulatory Visit (INDEPENDENT_AMBULATORY_CARE_PROVIDER_SITE_OTHER): Payer: Self-pay | Admitting: Nurse Practitioner

## 2021-07-20 DIAGNOSIS — R0789 Other chest pain: Secondary | ICD-10-CM

## 2021-07-20 NOTE — Telephone Encounter (Signed)
Called to check the status of referral. Patient states at the last appt on 07/06/21 FB stated that she would send a referral to cardiology. I made patient aware that provider will be out of clinic most of this week. Please advise.

## 2021-07-20 NOTE — Telephone Encounter (Signed)
Referral sent 

## 2021-07-21 NOTE — Telephone Encounter (Signed)
Left a message on patient voicemail that referral has been sent

## 2021-07-28 ENCOUNTER — Other Ambulatory Visit: Payer: Self-pay | Admitting: Internal Medicine

## 2021-07-28 DIAGNOSIS — R109 Unspecified abdominal pain: Secondary | ICD-10-CM

## 2021-07-28 NOTE — Telephone Encounter (Signed)
Requested medications are due for refill today early, filled 7/28  Requested medications are on the active medication list yes  Last refill 7/28  Last visit 6/16  Future visit scheduled no, was asked on 6/16 to return in one month  Notes to clinic No protocol for this med, please assess.

## 2021-08-16 ENCOUNTER — Other Ambulatory Visit: Payer: Self-pay | Admitting: Internal Medicine

## 2021-08-16 DIAGNOSIS — R109 Unspecified abdominal pain: Secondary | ICD-10-CM

## 2021-08-16 NOTE — Telephone Encounter (Signed)
Requested medication (s) are due for refill today: yes  Requested medication (s) are on the active medication list: yes  Last refill:  07/28/21 #90  Future visit scheduled: no  Notes to clinic:  med not assigned to a protocol    Requested Prescriptions  Pending Prescriptions Disp Refills   hyoscyamine (LEVSIN SL) 0.125 MG SL tablet [Pharmacy Med Name: HYOSCYAMINE 0.125 MG TAB SL] 90 tablet 0    Sig: TAKE 1 TABLET (0.125 MG TOTAL) BY MOUTH EVERY 6 (SIX) HOURS AS NEEDED.     Off-Protocol Failed - 08/16/2021  9:11 AM      Failed - Medication not assigned to a protocol, review manually.      Passed - Valid encounter within last 12 months    Recent Outpatient Visits           2 months ago Mild ankle edema   [facility] Reubin Milan, MD   9 months ago Acute vaginitis   [facility] Reubin Milan, MD   9 months ago Annual physical exam   [facility] Reubin Milan, MD   9 months ago Generalized anxiety disorder   [facility] Reubin Milan, MD   1 year ago BMI 37.0-37.9, adult   [facility] Reubin Milan, MD

## 2021-09-21 ENCOUNTER — Encounter: Payer: Self-pay | Admitting: General Surgery

## 2021-10-03 ENCOUNTER — Other Ambulatory Visit: Payer: Self-pay | Admitting: Internal Medicine

## 2021-10-03 DIAGNOSIS — F411 Generalized anxiety disorder: Secondary | ICD-10-CM

## 2021-10-03 NOTE — Telephone Encounter (Signed)
Requested Prescriptions  Pending Prescriptions Disp Refills  . buPROPion (WELLBUTRIN XL) 150 MG 24 hr tablet [Pharmacy Med Name: BUPROPION HCL XL 150 MG TABLET] 90 tablet 0    Sig: TAKE 1 TABLET BY MOUTH EVERY DAY     Psychiatry: Antidepressants - bupropion Passed - 10/03/2021  9:06 AM      Passed - Last BP in normal range    BP Readings from Last 1 Encounters:  07/02/21 138/83         Passed - Valid encounter within last 6 months    Recent Outpatient Visits          4 months ago Mild ankle edema   [facility] Reubin Milan, MD   10 months ago Acute vaginitis   [facility] Reubin Milan, MD   10 months ago Annual physical exam   [facility] Reubin Milan, MD   11 months ago Generalized anxiety disorder   [facility] Reubin Milan, MD   1 year ago BMI 37.0-37.9, adult   [facility] Reubin Milan, MD

## 2021-10-04 ENCOUNTER — Other Ambulatory Visit: Payer: Self-pay | Admitting: Internal Medicine

## 2021-10-05 ENCOUNTER — Ambulatory Visit (INDEPENDENT_AMBULATORY_CARE_PROVIDER_SITE_OTHER): Payer: BC Managed Care – PPO | Admitting: Vascular Surgery

## 2022-01-01 IMAGING — US US ABDOMEN COMPLETE
1 series · 14 of 25 positions shown · non-contrast
Comparison: 12/13/2015

CLINICAL DATA: Left upper quadrant abdominal pain over the last
several months.

EXAM:
ABDOMEN ULTRASOUND COMPLETE

[Series 1: us abdomen complete · 0.30mm/px · 14 of 107 slices shown]
[im 1/107]
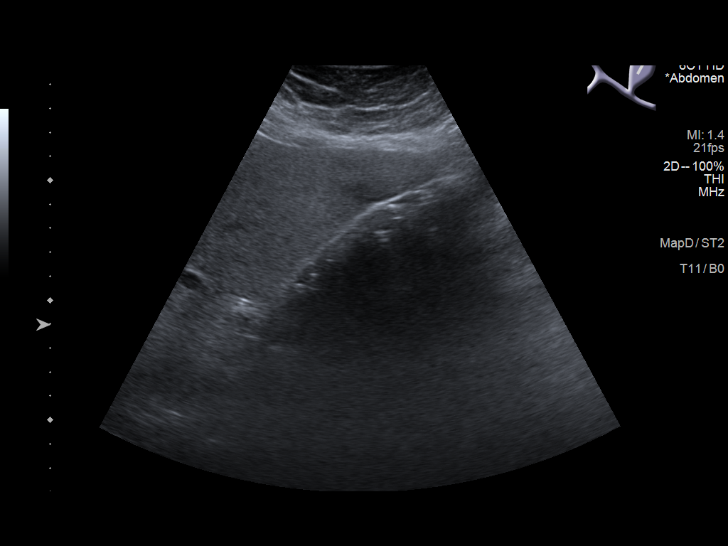
[im 9/107]
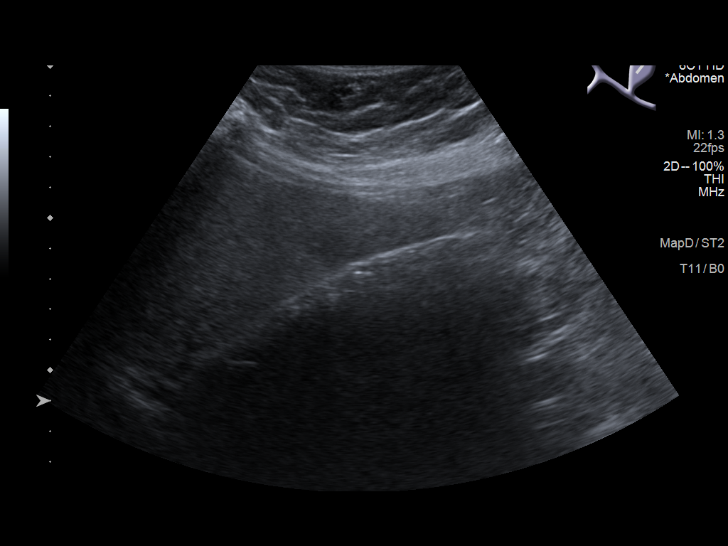
[im 18/107]
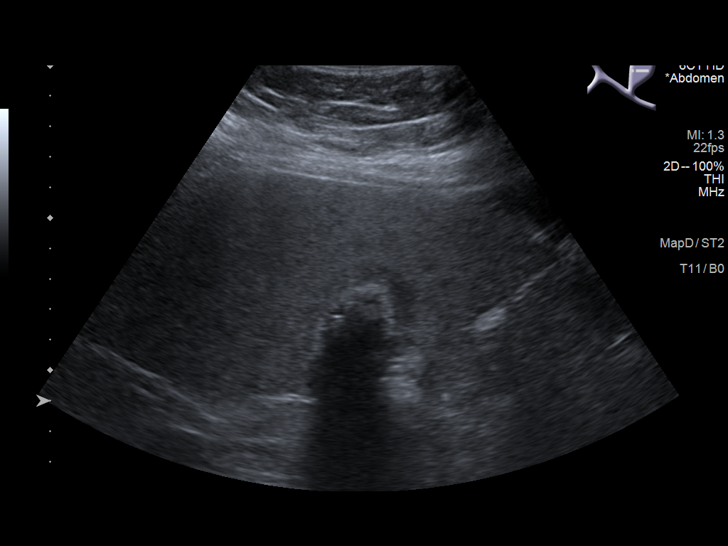
[im 27/107]
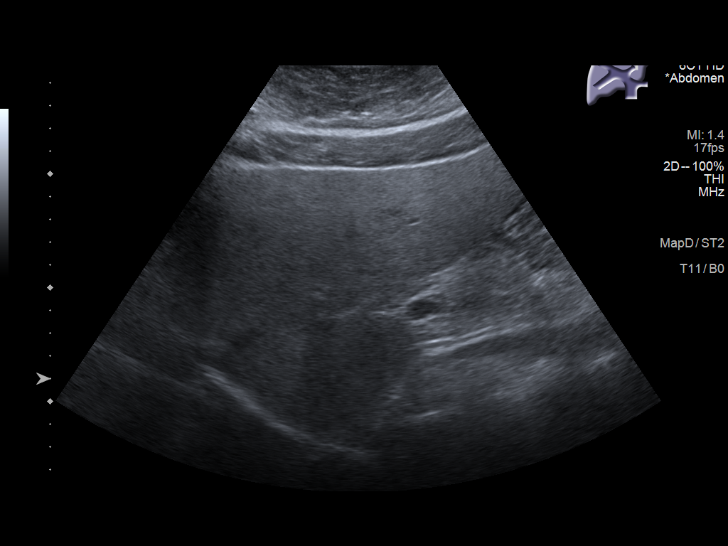
[im 36/107]
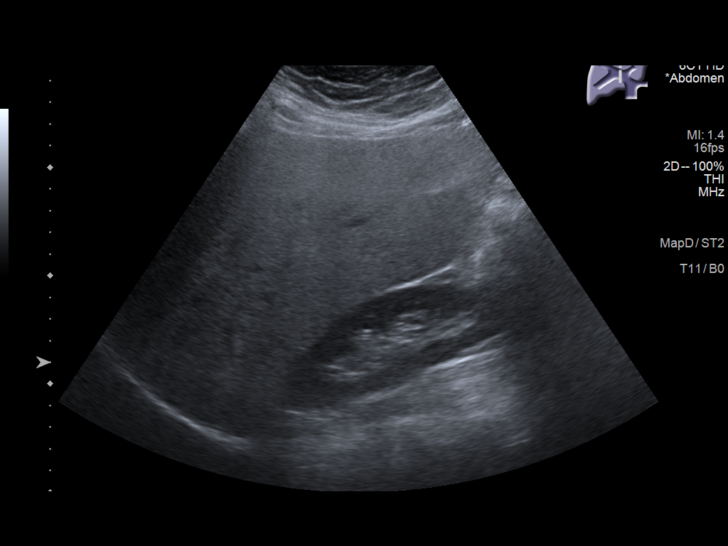
[im 40/107]
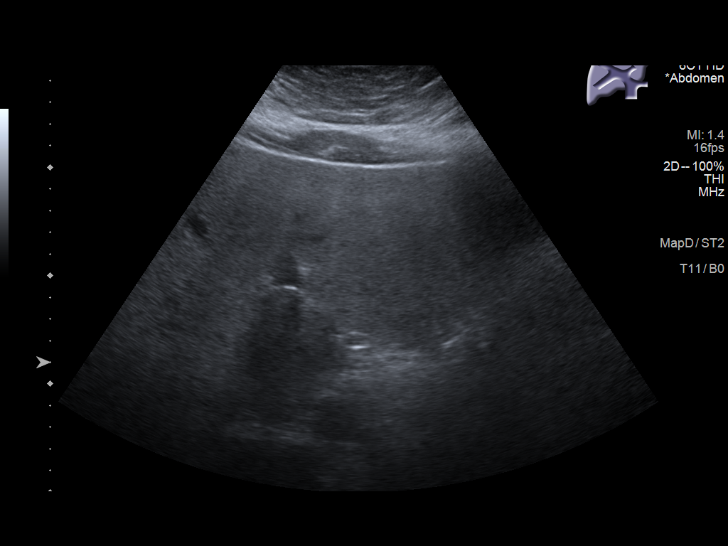
[im 49/107]
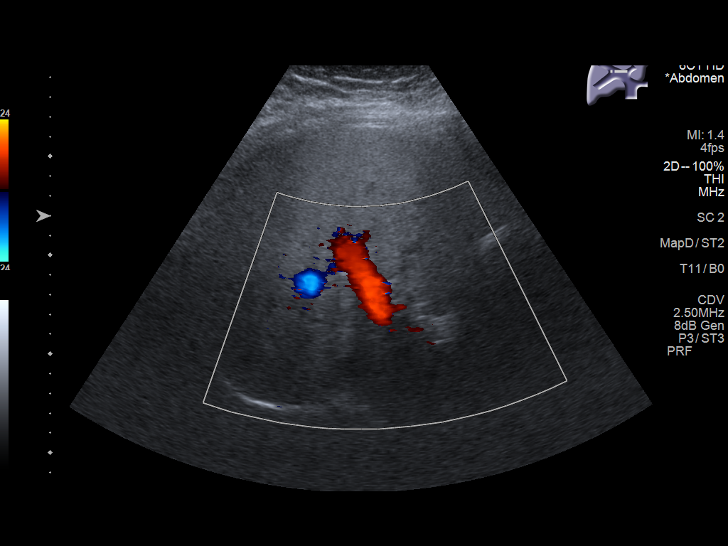
[im 58/107]
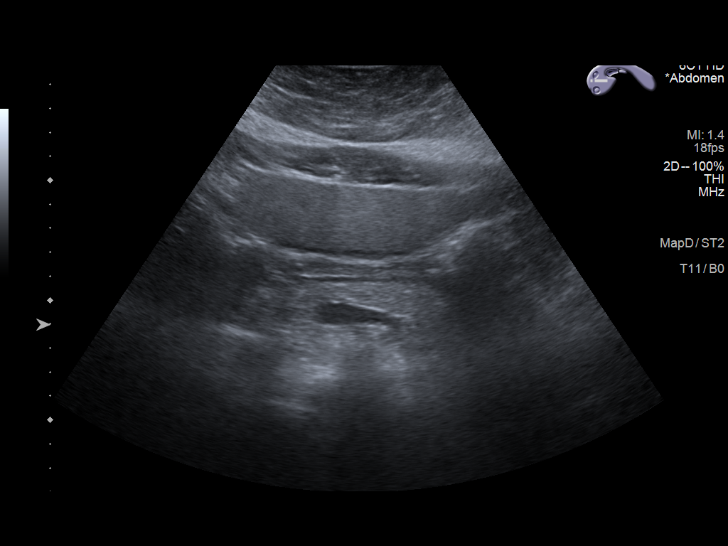
[im 67/107]
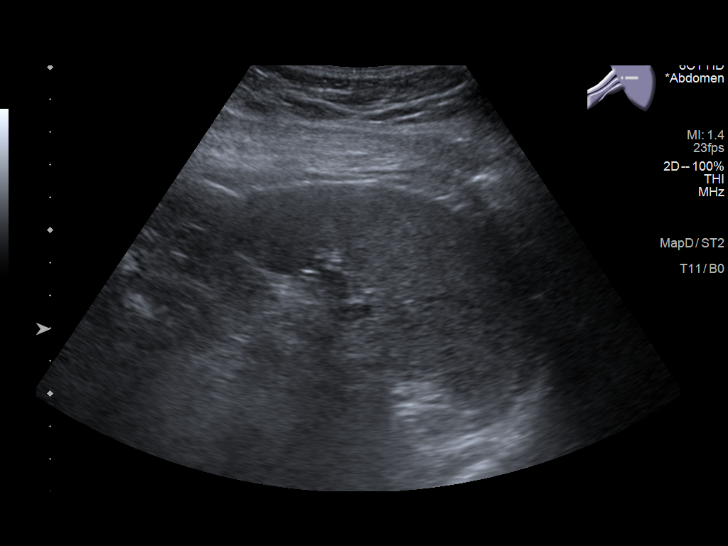
[im 71/107]
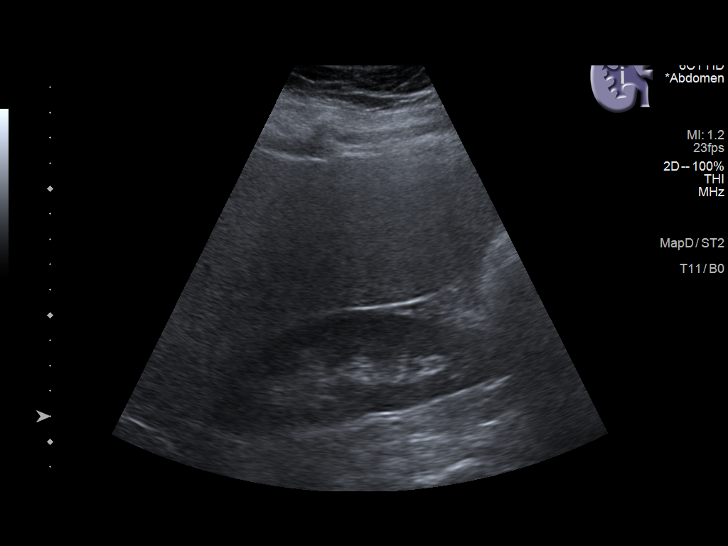
[im 80/107]
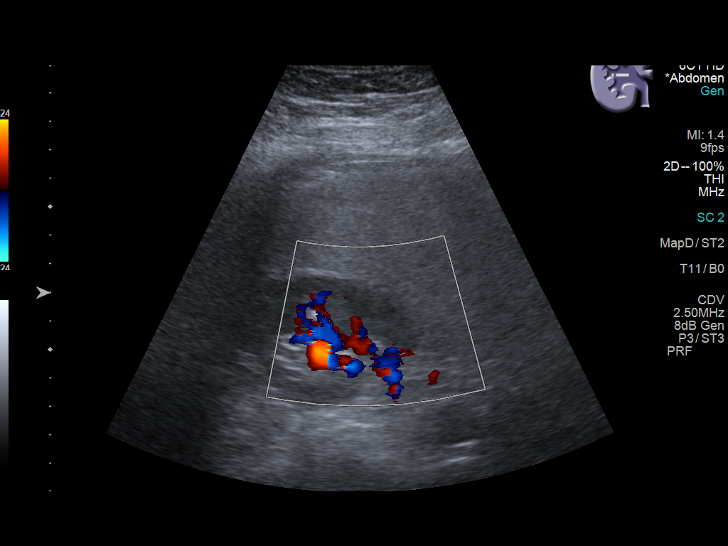
[im 89/107]
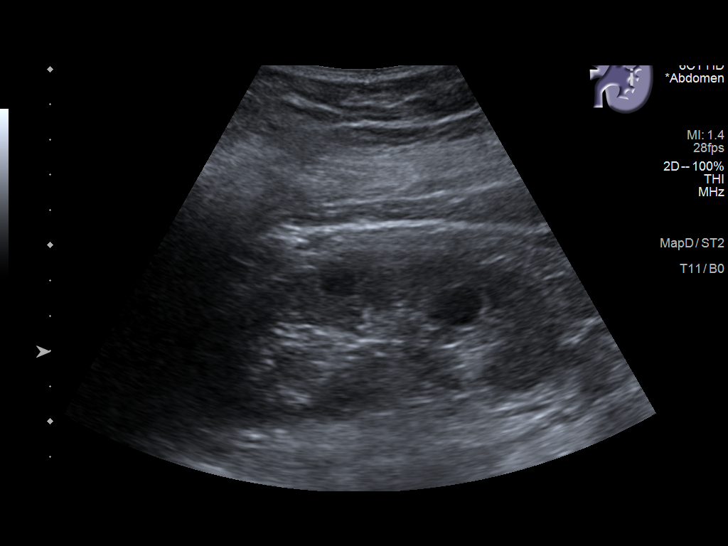
[im 98/107]
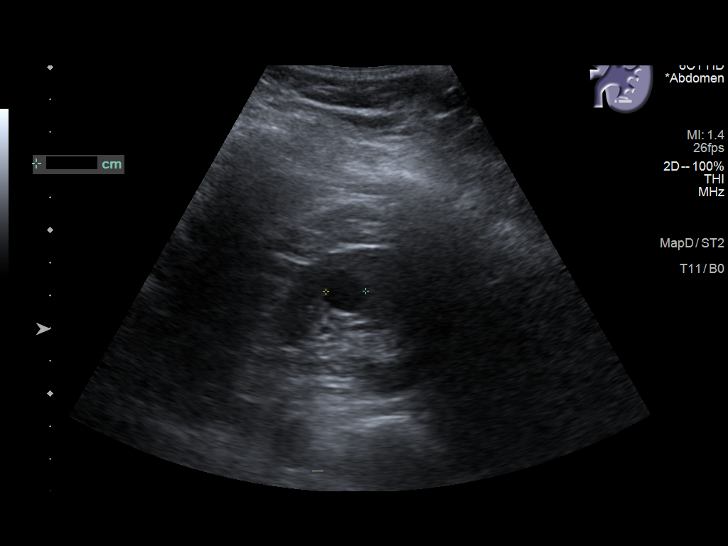
[im 107/107]
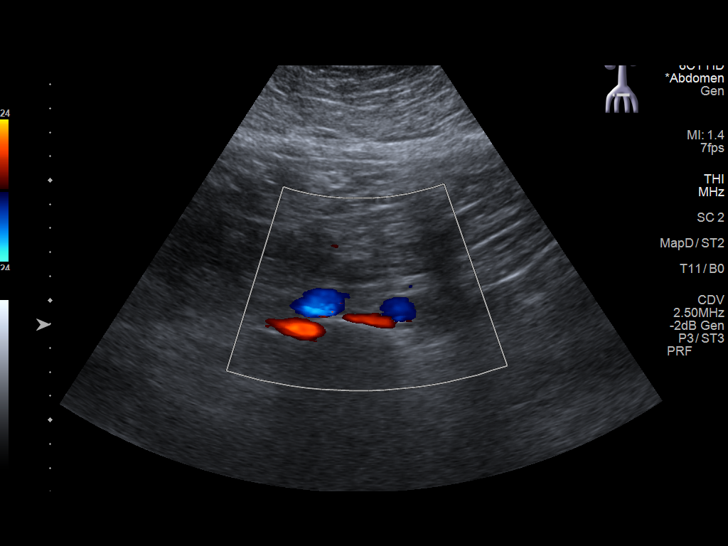

[14 of 25 positions shown; findings below may reference images not displayed]

FINDINGS: Gallbladder: The gallbladder appears to be filled with stones. No
wall thickening. No Murphy sign. No surrounding fluid.

Common bile duct: Diameter: 2.6 mm.  Normal.

Liver: Hepatomegaly with increased echogenicity suggesting fatty
change. No focal lesion or ductal dilatation. Portal vein is patent
on color Doppler imaging with normal direction of blood flow towards
the liver.

IVC: No abnormality visualized.

Pancreas: Visualized portion unremarkable.

Spleen: Size and appearance within normal limits.

Right Kidney: Length: 12.0 cm. Echogenicity within normal limits. No
mass or hydronephrosis visualized.

Left Kidney: Length: 11.3 cm. Few small cysts, no larger than
cm. No hydronephrosis or evidence of mass.

Abdominal aorta: No aneurysm visualized.

Other findings: None.
IMPRESSION: The gallbladder is filled with gallstones. No sonographic evidence
of cholecystitis or obstruction however.

Enlarged echogenic liver consistent with steatosis.

Spleen and pancreatic tail region appear unremarkable.

## 2022-10-11 ENCOUNTER — Encounter (INDEPENDENT_AMBULATORY_CARE_PROVIDER_SITE_OTHER): Payer: Self-pay

## 2022-11-02 ENCOUNTER — Telehealth: Payer: Self-pay

## 2022-11-02 NOTE — Telephone Encounter (Signed)
Transition Care Management Unsuccessful Follow-up Telephone Call  Date of discharge and from where:  [facility] 11/01/2022  Attempts:  1st Attempt  Reason for unsuccessful TCM follow-up call:  Left voice message

## 2022-11-03 ENCOUNTER — Telehealth: Payer: Self-pay

## 2022-11-03 NOTE — Telephone Encounter (Signed)
Transition Care Management Unsuccessful Follow-up Telephone Call  Date of discharge and from where:  [facility] 11/01/2022  Attempts:  2nd Attempt  Reason for unsuccessful TCM follow-up call:  Left voice message

## 2024-02-13 ENCOUNTER — Encounter: Payer: Self-pay | Admitting: Emergency Medicine

## 2024-02-13 ENCOUNTER — Ambulatory Visit: Admission: EM | Admit: 2024-02-13 | Discharge: 2024-02-13 | Disposition: A | Discharge status: Home / Self Care

## 2024-02-13 ENCOUNTER — Ambulatory Visit (INDEPENDENT_AMBULATORY_CARE_PROVIDER_SITE_OTHER)

## 2024-02-13 DIAGNOSIS — J069 Acute upper respiratory infection, unspecified: Secondary | ICD-10-CM

## 2024-02-13 DIAGNOSIS — J45901 Unspecified asthma with (acute) exacerbation: Secondary | ICD-10-CM | POA: Diagnosis not present

## 2024-02-13 DIAGNOSIS — R058 Other specified cough: Secondary | ICD-10-CM | POA: Diagnosis not present

## 2024-02-13 LAB — POC COVID19/FLU A&B COMBO
Covid Antigen, POC: NEGATIVE
Influenza A Antigen, POC: NEGATIVE
Influenza B Antigen, POC: NEGATIVE

## 2024-02-13 MED ORDER — ALBUTEROL SULFATE HFA 108 (90 BASE) MCG/ACT IN AERS: 1.0000 | INHALATION_SPRAY | Freq: Four times a day (QID) | RESPIRATORY_TRACT | 0 refills | Status: AC | PRN

## 2024-02-13 MED ORDER — AZITHROMYCIN 250 MG PO TABS: 250.0000 mg | ORAL_TABLET | Freq: Every day | ORAL | 0 refills | Status: AC

## 2024-02-13 MED ORDER — PREDNISONE 10 MG PO TABS: 40.0000 mg | ORAL_TABLET | Freq: Every day | ORAL | 0 refills | Status: AC

## 2024-02-13 NOTE — ED Triage Notes (Signed)
 Patient presents with c/o dyspnea, generalized body aches and productive cough x 1 vweek. Patient also states her chest is sore from coughing. Patient states she is alternating tylenol and ibuprofen for the symptoms.

## 2024-02-13 NOTE — Discharge Instructions (Addendum)
 Follow up with your primary care provider tomorrow.  Go to the emergency department if you have worsening symptoms.    Your COVID and flu tests are negative.    Use the albuterol inhaler as directed.  Take the Zithromax and prednisone as directed.    Your chest x-ray is pending.

## 2024-02-13 NOTE — ED Provider Notes (Signed)
 Marie Whitney    CSN: [id number] Arrival date & time: 02/13/24  1836      History   Chief Complaint Chief Complaint  Patient presents with   Cough   Generalized Body Aches   Shortness of Breath    HPI Marie Whitney is a 58 y.o. female.  Patient presents with 5-day history of chest congestion, productive cough, body aches.  She reports fever of 101.6 at the onset of her symptoms.  She has shortness of breath and chest tightness when coughing.  She has been treating her symptoms with Tylenol and ibuprofen.  Patient states she has history of asthma but is out of her albuterol inhaler.  The history is provided by the patient and medical records.    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      Home Medications    Prior to Admission medications   Medication Sig Start Date End Date Taking? Authorizing Provider  albuterol (VENTOLIN HFA) 108 (90 Base) MCG/ACT inhaler Inhale 1-2 puffs into the lungs every 6 (six) hours as needed. 02/13/24  Yes Mickie Bail, NP  azithromycin (ZITHROMAX) 250 MG tablet Take 1 tablet (250 mg total) by mouth daily. Take first 2 tablets together, then 1 every day until finished. 02/13/24  Yes Mickie Bail, NP   predniSONE (DELTASONE) 10 MG tablet Take 4 tablets (40 mg total) by mouth daily for 5 days. 02/13/24 02/18/24 Yes Mickie Bail, NP  buPROPion (WELLBUTRIN XL) 150 MG 24 hr tablet TAKE 1 TABLET BY MOUTH EVERY DAY 10/03/21   Reubin Milan, MD  cimetidine (TAGAMET) 400 MG tablet Take 1 tablet (400 mg total) by mouth 2 (two) times daily. 05/29/21   Reubin Milan, MD  cyclobenzaprine (FLEXERIL) 10 MG tablet Take 1 tablet (10 mg total) by mouth 3 (three) times daily as needed for muscle spasms. 05/29/21   Reubin Milan, MD  fexofenadine [facility] ALLERGY) 180 MG tablet Take 1 tablet (180 mg total) by mouth daily. 09/16/17   Reubin Milan, MD  finasteride (PROSCAR) 5 MG tablet Take by mouth. 06/03/21   [provider]  fluticasone (FLONASE) 50 MCG/ACT nasal spray Place 2 sprays into both nostrils daily. 03/16/21   [provider]  hyoscyamine (LEVSIN SL) 0.125 MG SL tablet TAKE 1 TABLET (0.125 MG TOTAL) BY MOUTH EVERY 6 (SIX) HOURS AS NEEDED. 07/28/21   Reubin Milan, MD  LINZESS 145 MCG CAPS capsule TAKE 1 CAPSULE BY MOUTH EVERY DAY BEFORE BREAKFAST 10/05/21   Reubin Milan, MD  Tazarotene (ARAZLO) 0.045 % LOTN Apply 1 application topically daily.    [provider]  temazepam (RESTORIL)  15 MG capsule Take 1 capsule (15 mg total) by mouth at bedtime as needed for sleep. 05/29/21   Reubin Milan, MD  valACYclovir (VALTREX) 1000 MG tablet Take 1 tablet (1,000 mg total) by mouth daily. 05/29/21   Reubin Milan, MD  Vitamin D, Ergocalciferol, (DRISDOL) 1.25 MG (50000 UNIT) CAPS capsule Take 50,000 Units by mouth once a week. 01/01/21   [provider]    Family History Family History  Problem Relation Age of Onset   CAD Mother    Breast cancer Neg Hx     Social History Social History   Tobacco Use   Smoking status: Never   Smokeless tobacco: Never  Substance Use Topics   Alcohol use: No    Alcohol/week: 2.0 standard drinks of alcohol     Types: 2 Standard drinks or equivalent per week     Allergies   Bactrim [sulfamethoxazole-trimethoprim]   Review of Systems Review of Systems  Constitutional:  Positive for chills and fever.  HENT:  Positive for congestion. Negative for ear pain and sore throat.   Respiratory:  Positive for cough, chest tightness and shortness of breath.      Physical Exam Triage Vital Signs ED Triage Vitals  Encounter Vitals Group     BP 02/13/24 1856 (!) 148/90     Systolic BP Percentile --      Diastolic BP Percentile --      Pulse Rate 02/13/24 1856 81     Resp 02/13/24 1856 18     Temp 02/13/24 1856 98.9 F (37.2 C)     Temp Source 02/13/24 1856 Oral     SpO2 02/13/24 1856 96 %     Weight --      Height --      Head Circumference --      Peak Flow --      Pain Score 02/13/24 1853 8     Pain Loc --      Pain Education --      Exclude from Growth Chart --    No data found.  Updated Vital Signs BP (!) 148/90 (BP Location: Right Arm)   Pulse 81   Temp 98.9 F (37.2 C) (Oral)   Resp 18   LMP 11/12/2017   SpO2 96%   Visual Acuity Right Eye Distance:   Left Eye Distance:   Bilateral Distance:    Right Eye Near:   Left Eye Near:    Bilateral Near:     Physical Exam Constitutional:      General: She is not in acute distress.    Appearance: She is obese.  HENT:     Right Ear: Tympanic membrane normal.     Left Ear: Tympanic membrane normal.     Nose: Nose normal.     Mouth/Throat:     Mouth: Mucous membranes are moist.     Pharynx: Oropharynx is clear.  Cardiovascular:     Rate and Rhythm: Normal rate and regular rhythm.     Heart sounds: Normal heart sounds.  Pulmonary:     Effort: Pulmonary effort is normal. No respiratory distress.     Breath sounds: Normal breath sounds.  Neurological:     Mental Status: She is alert.      UC Treatments / Results  Labs (all labs ordered are listed, but only abnormal results are displayed) Labs Reviewed  POC  COVID19/FLU A&B COMBO - Normal    EKG   Radiology No results found.  Procedures Procedures (including critical care time)  Medications Ordered in UC Medications - No data to display  Initial Impression / Assessment and Plan / UC Course  I have reviewed the triage vital signs and the nursing notes.  Pertinent labs & imaging results that were available during my care of the patient were reviewed by me and considered in my medical decision making (see chart for details).    Productive cough, asthma exacerbation, acute URI.  No respiratory distress.  O2 sat 96% on room air.   CXR  Treating with albuterol inhaler, prednisone, Zithromax.  Instructed patient to follow-up with her PCP tomorrow.  ED precautions given.  Education provided on asthma and URI.  She agrees to plan of care.  Final Clinical Impressions(s) / UC Diagnoses   Final diagnoses:  Productive cough  Asthma with acute exacerbation, unspecified asthma severity, unspecified whether persistent  Acute upper respiratory infection     Discharge Instructions      Follow up with your primary care provider tomorrow.  Go to the emergency department if you have worsening symptoms.    Your COVID and flu tests are negative.    Use the albuterol inhaler as directed.  Take the Zithromax and prednisone as directed.    Your chest x-ray is pending.        ED Prescriptions     Medication Sig Dispense Auth. Provider   albuterol (VENTOLIN HFA) 108 (90 Base) MCG/ACT inhaler Inhale 1-2 puffs into the lungs every 6 (six) hours as needed. 18 g Mickie Bail, NP   predniSONE (DELTASONE) 10 MG tablet Take 4 tablets (40 mg total) by mouth daily for 5 days. 20 tablet Mickie Bail, NP   azithromycin (ZITHROMAX) 250 MG tablet Take 1 tablet (250 mg total) by mouth daily. Take first 2 tablets together, then 1 every day until finished. 6 tablet Mickie Bail, NP      PDMP not reviewed this encounter.
